# Patient Record
Sex: Female | Born: 1985 | Race: White | Hispanic: No | Marital: Single | Smoking: Current some day smoker
Health system: Southern US, Community
[De-identification: ages and names within clinical notes are randomized; demographics above are authoritative.]

## PROBLEM LIST (undated history)

## (undated) DIAGNOSIS — G629 Polyneuropathy, unspecified: Secondary | ICD-10-CM

## (undated) DIAGNOSIS — F1111 Opioid abuse, in remission: Secondary | ICD-10-CM

## (undated) DIAGNOSIS — I38 Endocarditis, valve unspecified: Secondary | ICD-10-CM

## (undated) DIAGNOSIS — F319 Bipolar disorder, unspecified: Secondary | ICD-10-CM

## (undated) DIAGNOSIS — I10 Essential (primary) hypertension: Secondary | ICD-10-CM

## (undated) DIAGNOSIS — Z954 Presence of other heart-valve replacement: Secondary | ICD-10-CM

## (undated) HISTORY — PX: CARDIAC SURGERY: SHX584

## (undated) HISTORY — DX: Essential (primary) hypertension: I10

## (undated) HISTORY — PX: TRICUSPID VALVE REPLACEMENT: SHX816

## (undated) HISTORY — DX: Presence of other heart-valve replacement: Z95.4

## (undated) HISTORY — DX: Opioid abuse, in remission: F11.11

## (undated) HISTORY — DX: Bipolar disorder, unspecified: F31.9

---

## 2015-04-07 ENCOUNTER — Other Ambulatory Visit: Payer: Self-pay | Admitting: Specialist

## 2015-04-07 DIAGNOSIS — R0602 Shortness of breath: Secondary | ICD-10-CM

## 2015-04-12 ENCOUNTER — Other Ambulatory Visit: Payer: Self-pay | Admitting: Specialist

## 2015-04-13 ENCOUNTER — Ambulatory Visit
Admission: RE | Admit: 2015-04-13 | Discharge: 2015-04-13 | Disposition: A | Discharge status: Home / Self Care | Payer: BLUE CROSS/BLUE SHIELD | Source: Ambulatory Visit | Attending: Specialist | Admitting: Specialist

## 2015-04-13 DIAGNOSIS — R0602 Shortness of breath: Secondary | ICD-10-CM | POA: Insufficient documentation

## 2015-04-21 ENCOUNTER — Encounter (HOSPITAL_COMMUNITY): Payer: Self-pay | Admitting: Emergency Medicine

## 2015-04-21 ENCOUNTER — Emergency Department (HOSPITAL_COMMUNITY)
Admission: EM | Admit: 2015-04-21 | Discharge: 2015-04-21 | Disposition: A | Discharge status: Home / Self Care | Payer: BLUE CROSS/BLUE SHIELD | Attending: Emergency Medicine | Admitting: Emergency Medicine

## 2015-04-21 DIAGNOSIS — Y998 Other external cause status: Secondary | ICD-10-CM | POA: Insufficient documentation

## 2015-04-21 DIAGNOSIS — R21 Rash and other nonspecific skin eruption: Secondary | ICD-10-CM | POA: Diagnosis not present

## 2015-04-21 DIAGNOSIS — Y9389 Activity, other specified: Secondary | ICD-10-CM | POA: Diagnosis not present

## 2015-04-21 DIAGNOSIS — M791 Myalgia: Secondary | ICD-10-CM | POA: Insufficient documentation

## 2015-04-21 DIAGNOSIS — J069 Acute upper respiratory infection, unspecified: Secondary | ICD-10-CM

## 2015-04-21 DIAGNOSIS — X58XXXA Exposure to other specified factors, initial encounter: Secondary | ICD-10-CM | POA: Diagnosis not present

## 2015-04-21 DIAGNOSIS — R05 Cough: Secondary | ICD-10-CM | POA: Diagnosis present

## 2015-04-21 DIAGNOSIS — Y9289 Other specified places as the place of occurrence of the external cause: Secondary | ICD-10-CM | POA: Diagnosis not present

## 2015-04-21 DIAGNOSIS — S50852A Superficial foreign body of left forearm, initial encounter: Secondary | ICD-10-CM | POA: Diagnosis not present

## 2015-04-21 DIAGNOSIS — R252 Cramp and spasm: Secondary | ICD-10-CM

## 2015-04-21 DIAGNOSIS — I38 Endocarditis, valve unspecified: Secondary | ICD-10-CM | POA: Diagnosis not present

## 2015-04-21 DIAGNOSIS — Z720 Tobacco use: Secondary | ICD-10-CM | POA: Insufficient documentation

## 2015-04-21 HISTORY — DX: Endocarditis, valve unspecified: I38

## 2015-04-21 LAB — I-STAT CHEM 8, ED
BUN: 14 mg/dL (ref 6–20)
CALCIUM ION: 1.21 mmol/L (ref 1.12–1.23)
CHLORIDE: 105 mmol/L (ref 101–111)
CREATININE: 0.9 mg/dL (ref 0.44–1.00)
Glucose, Bld: 109 mg/dL — ABNORMAL HIGH (ref 65–99)
HEMATOCRIT: 41 % (ref 36.0–46.0)
Hemoglobin: 13.9 g/dL (ref 12.0–15.0)
Potassium: 3.5 mmol/L (ref 3.5–5.1)
Sodium: 142 mmol/L (ref 135–145)
TCO2: 21 mmol/L (ref 0–100)

## 2015-04-21 NOTE — Discharge Instructions (Signed)
RECOMMEND SUPPORTIVE CARE OF SYMPTOMS OF RESPIRATORY VIRUS - ANTIHISTAMINES, TYLENOL/IBUPROFEN AS NEEDED, PUSH FLUIDS. YOU CAN ALLOW THE REMAINING SUTURE TO WORK ITSELF OUT OR FOLLOW UP WITH SURGERY FOR REMOVAL. MUSCLE CRAMPING CAN BE TREATED WITH LOTS OF FLUIDS (WATER, JUICES, NON-CAFFEINATED BEVERAGES).  FOLLOW UP WITH YOUR DOCTOR FOR ROUTINE MEDICAL CONCERNS.   Leg Cramps Leg cramps that occur during exercise can be caused by poor circulation or dehydration. However, muscle cramps that occur at rest or during the night are usually not due to any serious medical problem. Heat cramps may cause muscle spasms during hot weather.  CAUSES There is no clear cause for muscle cramps. However, dehydration may be a factor for those who do not drink enough fluids and those who exercise in the heat. Imbalances in the level of sodium, potassium, calcium or magnesium in the muscle tissue may also be a factor. Some medications, such as water pills (diuretics), may cause loss of chemicals that the body needs (like sodium and potassium) and cause muscle cramps. TREATMENT   Make sure your diet has enough fluids and essential minerals for the muscle to work normally.  Avoid strenuous exercise for several days if you have been having frequent leg cramps.  Stretch and massage the cramped muscle for several minutes.  Some medicines may be helpful in some patients with night cramps. Only take over-the-counter or prescription medicines as directed by your caregiver. SEEK IMMEDIATE MEDICAL CARE IF:   Your leg cramps become worse.  Your foot becomes cold, numb, or blue. Document Released: 11/30/2004 Document Revised: 01/15/2012 Document Reviewed: 11/17/2008 [facility] Patient Information 2015 [place], [place]. This information is not intended to replace advice given to you by your health care provider. Make sure you discuss any questions you have with your health care provider. Upper Respiratory Infection, Adult An  upper respiratory infection (URI) is also sometimes known as the common cold. The upper respiratory tract includes the nose, sinuses, throat, trachea, and bronchi. Bronchi are the airways leading to the lungs. Most people improve within 1 week, but symptoms can last up to 2 weeks. A residual cough may last even longer.  CAUSES Many different viruses can infect the tissues lining the upper respiratory tract. The tissues become irritated and inflamed and often become very moist. Mucus production is also common. A cold is contagious. You can easily spread the virus to others by oral contact. This includes kissing, sharing a glass, coughing, or sneezing. Touching your mouth or nose and then touching a surface, which is then touched by another person, can also spread the virus. SYMPTOMS  Symptoms typically develop 1 to 3 days after you come in contact with a cold virus. Symptoms vary from person to person. They may include:  Runny nose.  Sneezing.  Nasal congestion.  Sinus irritation.  Sore throat.  Loss of voice (laryngitis).  Cough.  Fatigue.  Muscle aches.  Loss of appetite.  Headache.  Low-grade fever. DIAGNOSIS  You might diagnose your own cold based on familiar symptoms, since most people get a cold 2 to 3 times a year. Your caregiver can confirm this based on your exam. Most importantly, your caregiver can check that your symptoms are not due to another disease such as strep throat, sinusitis, pneumonia, asthma, or epiglottitis. Blood tests, throat tests, and X-rays are not necessary to diagnose a common cold, but they may sometimes be helpful in excluding other more serious diseases. Your caregiver will decide if any further tests are required. RISKS AND COMPLICATIONS  You may be at risk for a more severe case of the common cold if you smoke cigarettes, have chronic heart disease (such as heart failure) or lung disease (such as asthma), or if you have a weakened immune system. The  very young and very old are also at risk for more serious infections. Bacterial sinusitis, middle ear infections, and bacterial pneumonia can complicate the common cold. The common cold can worsen asthma and chronic obstructive pulmonary disease (COPD). Sometimes, these complications can require emergency medical care and may be life-threatening. PREVENTION  The best way to protect against getting a cold is to practice good hygiene. Avoid oral or hand contact with people with cold symptoms. Wash your hands often if contact occurs. There is no clear evidence that vitamin C, vitamin E, echinacea, or exercise reduces the chance of developing a cold. However, it is always recommended to get plenty of rest and practice good nutrition. TREATMENT  Treatment is directed at relieving symptoms. There is no cure. Antibiotics are not effective, because the infection is caused by a virus, not by bacteria. Treatment may include:  Increased fluid intake. Sports drinks offer valuable electrolytes, sugars, and fluids.  Breathing heated mist or steam (vaporizer or shower).  Eating chicken soup or other clear broths, and maintaining good nutrition.  Getting plenty of rest.  Using gargles or lozenges for comfort.  Controlling fevers with ibuprofen or acetaminophen as directed by your caregiver.  Increasing usage of your inhaler if you have asthma. Zinc gel and zinc lozenges, taken in the first 24 hours of the common cold, can shorten the duration and lessen the severity of symptoms. Pain medicines may help with fever, muscle aches, and throat pain. A variety of non-prescription medicines are available to treat congestion and runny nose. Your caregiver can make recommendations and may suggest nasal or lung inhalers for other symptoms.  HOME CARE INSTRUCTIONS   Only take over-the-counter or prescription medicines for pain, discomfort, or fever as directed by your caregiver.  Use a warm mist humidifier or inhale  steam from a shower to increase air moisture. This may keep secretions moist and make it easier to breathe.  Drink enough water and fluids to keep your urine clear or pale yellow.  Rest as needed.  Return to work when your temperature has returned to normal or as your caregiver advises. You may need to stay home longer to avoid infecting others. You can also use a face mask and careful hand washing to prevent spread of the virus. SEEK MEDICAL CARE IF:   After the first few days, you feel you are getting worse rather than better.  You need your caregiver's advice about medicines to control symptoms.  You develop chills, worsening shortness of breath, or brown or red sputum. These may be signs of pneumonia.  You develop yellow or brown nasal discharge or pain in the face, especially when you bend forward. These may be signs of sinusitis.  You develop a fever, swollen neck glands, pain with swallowing, or white areas in the back of your throat. These may be signs of strep throat. SEEK IMMEDIATE MEDICAL CARE IF:   You have a fever.  You develop severe or persistent headache, ear pain, sinus pain, or chest pain.  You develop wheezing, a prolonged cough, cough up blood, or have a change in your usual mucus (if you have chronic lung disease).  You develop sore muscles or a stiff neck. Document Released: 04/18/2001 Document Revised: 01/15/2012 Document Reviewed: 01/28/2014  [employer]® Patient Information ©2015 [employer]. This information is not intended to replace advice given to you by your health care provider. Make sure you discuss any questions you have with your health care provider. ° °

## 2015-04-21 NOTE — ED Provider Notes (Signed)
CSN: [id number]     Arrival date & time 04/21/15  0010 History   First MD Initiated Contact with Patient 04/21/15 0048     Chief Complaint  Patient presents with  . URI     (Consider location/radiation/quality/duration/timing/severity/associated sxs/prior Treatment) Patient is a 29 y.o. female presenting with URI. The history is provided by the patient. No language interpreter was used.  URI Presenting symptoms: congestion, cough, rhinorrhea and sore throat   Presenting symptoms: no fever   Severity:  Mild Associated symptoms comment:  Congestion, cough, sore throat for one month. No known fever. She also complains of foreign body in the left arm that she feels is a stitch left in from treatment of an abscess. She also complains of severe cramping in her calves bilaterally, one side or the other, and a rash to lower extremities. Cramping and rah have been worsening over time. 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    Past Medical History  Diagnosis Date  . Endocarditis     drug use   Past Surgical History  Procedure Laterality Date  . Tricuspid valve replacement      x 3; open heart surgery   History reviewed. No pertinent family history. History  Substance Use Topics  . Smoking status: Current Every Day Smoker -- 0.50 packs/day  . Smokeless tobacco: Not on file  . Alcohol Use: No   OB History    No data available     Review of Systems  Constitutional: Negative for fever and chills.  HENT: Positive for congestion, rhinorrhea and sore throat.   Respiratory: Positive for cough.   Cardiovascular: Negative.   Gastrointestinal: Negative.  Negative for nausea and vomiting.  Musculoskeletal:       See HPI.  Skin: Positive for rash.  Neurological: Negative.       Allergies  Kiwi extract; Pineapple; Sulfa antibiotics; and Tramadol  Home Medications   Prior to Admission medications   Not on File   BP 143/99 mmHg  Pulse 98  Temp(Src)  98.8 F (37.1 C) (Oral)  Resp 18  SpO2 100%  LMP 03/31/2015 Physical Exam  Constitutional: She is oriented to person, place, and time. She appears well-developed and well-nourished.  HENT:  Head: Normocephalic.  Neck: Normal range of motion. Neck supple.  Cardiovascular: Normal rate and regular rhythm.   Pulmonary/Chest: Effort normal and breath sounds normal.  Abdominal: Soft. Bowel sounds are normal. There is no tenderness. There is no rebound and no guarding.  Musculoskeletal: Normal range of motion.  Calf tenderness bilaterally. Soft muscle body.   Neurological: She is alert and oriented to person, place, and time.  Skin: Skin is warm and dry. No rash noted.  Non-blanching petechial rash bilateral LE's anteriorly.   Psychiatric: She has a normal mood and affect.    ED Course  Procedures (including critical care time) Labs Review Labs Reviewed  I-STAT CHEM 8, ED   Results for orders placed or performed during the hospital encounter of 04/21/15  I-stat Chem 8, ED  Result Value Ref Range   Sodium 142 135 - 145 mmol/L   Potassium 3.5 3.5 - 5.1 mmol/L   Chloride 105 101 - 111 mmol/L   BUN 14 6 - 20 mg/dL   Creatinine, Ser 2.80 0.44 - 1.00 mg/dL   Glucose, Bld 034 (H) 65 - 99 mg/dL   Calcium, Ion 9.17 9.15 - 1.23 mmol/L   TCO2 21 0 - 100 mmol/L   Hemoglobin 13.9 12.0 -  15.0 g/dL   HCT 40.9 81.1 - 91.4 %    Imaging Review No results found.   EKG Interpretation None      MDM   Final diagnoses:  None    1. URI 2. Foreign body, left FA 3. Muscle cramps  URI - lungs clear, afebrile, normal O2 sat/VS - suspect viral URI. FB left FA - appears to be suture material embedded in arm at scarred site. Discussed with Dr. Nicanor Alcon. Will not attempt removal. Recommended natural expulsion or, if patient prefers, follow up with surgery. Muscle cramping: potassium normal. Will provide Flexeril for symptomatic relief.   Strongly encouraged follow up with PCP.    Elpidio Anis, PA-C 04/21/15 0211  April Palumbo, MD 04/21/15 0225

## 2015-04-21 NOTE — ED Notes (Signed)
Pt states that she has had a cough and nasal drainage, she has an abcess with a stitch stuck in her arm and she has had cramping in the back of her legs. Alert and oriented.

## 2015-04-22 ENCOUNTER — Other Ambulatory Visit: Payer: Self-pay | Admitting: Internal Medicine

## 2015-04-22 ENCOUNTER — Ambulatory Visit
Admission: RE | Admit: 2015-04-22 | Discharge: 2015-04-22 | Disposition: A | Discharge status: Home / Self Care | Payer: BLUE CROSS/BLUE SHIELD | Source: Ambulatory Visit | Attending: Internal Medicine | Admitting: Internal Medicine

## 2015-04-22 DIAGNOSIS — M79661 Pain in right lower leg: Secondary | ICD-10-CM | POA: Insufficient documentation

## 2015-04-22 DIAGNOSIS — M7989 Other specified soft tissue disorders: Secondary | ICD-10-CM | POA: Diagnosis not present

## 2015-04-22 DIAGNOSIS — M79662 Pain in left lower leg: Secondary | ICD-10-CM | POA: Insufficient documentation

## 2015-09-30 ENCOUNTER — Encounter (HOSPITAL_COMMUNITY): Payer: Self-pay | Admitting: *Deleted

## 2015-09-30 DIAGNOSIS — Z791 Long term (current) use of non-steroidal anti-inflammatories (NSAID): Secondary | ICD-10-CM | POA: Diagnosis not present

## 2015-09-30 DIAGNOSIS — Z7982 Long term (current) use of aspirin: Secondary | ICD-10-CM | POA: Insufficient documentation

## 2015-09-30 DIAGNOSIS — Z79818 Long term (current) use of other agents affecting estrogen receptors and estrogen levels: Secondary | ICD-10-CM | POA: Diagnosis not present

## 2015-09-30 DIAGNOSIS — M549 Dorsalgia, unspecified: Secondary | ICD-10-CM | POA: Diagnosis not present

## 2015-09-30 DIAGNOSIS — R61 Generalized hyperhidrosis: Secondary | ICD-10-CM | POA: Diagnosis not present

## 2015-09-30 DIAGNOSIS — I2699 Other pulmonary embolism without acute cor pulmonale: Secondary | ICD-10-CM | POA: Insufficient documentation

## 2015-09-30 DIAGNOSIS — Z7951 Long term (current) use of inhaled steroids: Secondary | ICD-10-CM | POA: Insufficient documentation

## 2015-09-30 DIAGNOSIS — F17210 Nicotine dependence, cigarettes, uncomplicated: Secondary | ICD-10-CM | POA: Diagnosis not present

## 2015-09-30 DIAGNOSIS — Z79899 Other long term (current) drug therapy: Secondary | ICD-10-CM | POA: Insufficient documentation

## 2015-09-30 DIAGNOSIS — R079 Chest pain, unspecified: Secondary | ICD-10-CM | POA: Diagnosis present

## 2015-09-30 DIAGNOSIS — R011 Cardiac murmur, unspecified: Secondary | ICD-10-CM | POA: Insufficient documentation

## 2015-09-30 DIAGNOSIS — R110 Nausea: Secondary | ICD-10-CM | POA: Diagnosis not present

## 2015-09-30 DIAGNOSIS — R0789 Other chest pain: Secondary | ICD-10-CM | POA: Diagnosis not present

## 2015-09-30 NOTE — ED Notes (Signed)
Pt c/o chest pain, seeing black dots, dry mouth and dry nose.

## 2015-10-01 ENCOUNTER — Emergency Department (HOSPITAL_COMMUNITY): Payer: Medicare Other

## 2015-10-01 ENCOUNTER — Emergency Department (HOSPITAL_COMMUNITY)
Admission: EM | Admit: 2015-10-01 | Discharge: 2015-10-01 | Disposition: A | Discharge status: Home / Self Care | Payer: Medicare Other | Attending: Emergency Medicine | Admitting: Emergency Medicine

## 2015-10-01 DIAGNOSIS — I2699 Other pulmonary embolism without acute cor pulmonale: Secondary | ICD-10-CM

## 2015-10-01 DIAGNOSIS — R079 Chest pain, unspecified: Secondary | ICD-10-CM

## 2015-10-01 LAB — BASIC METABOLIC PANEL
Anion gap: 11 (ref 5–15)
BUN: 15 mg/dL (ref 6–20)
CHLORIDE: 105 mmol/L (ref 101–111)
CO2: 24 mmol/L (ref 22–32)
Calcium: 9.9 mg/dL (ref 8.9–10.3)
Creatinine, Ser: 0.97 mg/dL (ref 0.44–1.00)
GFR calc Af Amer: >60 mL/min (ref >60)
GFR calc non Af Amer: >60 mL/min (ref >60)
GLUCOSE: 145 mg/dL — AB (ref 65–99)
POTASSIUM: 4 mmol/L (ref 3.5–5.1)
Sodium: 140 mmol/L (ref 135–145)

## 2015-10-01 LAB — CBC
HEMATOCRIT: 42.9 % (ref 36.0–46.0)
Hemoglobin: 14.5 g/dL (ref 12.0–15.0)
MCH: 30.3 pg (ref 26.0–34.0)
MCHC: 33.8 g/dL (ref 30.0–36.0)
MCV: 89.7 fL (ref 78.0–100.0)
Platelets: 232 10*3/uL (ref 150–400)
RBC: 4.78 MIL/uL (ref 3.87–5.11)
RDW: 14.6 % (ref 11.5–15.5)
WBC: 11.7 10*3/uL — ABNORMAL HIGH (ref 4.0–10.5)

## 2015-10-01 LAB — I-STAT TROPONIN, ED: Troponin i, poc: 0.02 ng/mL (ref 0.00–0.08)

## 2015-10-01 MED ORDER — XARELTO VTE STARTER PACK 15 & 20 MG PO TBPK: 15.0000 mg | ORAL_TABLET | ORAL | Status: DC

## 2015-10-01 MED ORDER — HYDROCODONE-ACETAMINOPHEN 5-325 MG PO TABS
1.0000 | ORAL_TABLET | Freq: Once | ORAL | Status: AC
  Administered 2015-10-01: 1 via ORAL
  Filled 2015-10-01: qty 1

## 2015-10-01 MED ORDER — SODIUM CHLORIDE 0.9 % IV BOLUS (SEPSIS)
1000.0000 mL | Freq: Once | INTRAVENOUS | Status: AC
  Administered 2015-10-01: 1000 mL via INTRAVENOUS

## 2015-10-01 MED ORDER — IOHEXOL 350 MG/ML SOLN
100.0000 mL | Freq: Once | INTRAVENOUS | Status: AC | PRN
  Administered 2015-10-01: 100 mL via INTRAVENOUS

## 2015-10-01 MED ORDER — IBUPROFEN 400 MG PO TABS
600.0000 mg | ORAL_TABLET | Freq: Once | ORAL | Status: AC
  Administered 2015-10-01: 600 mg via ORAL
  Filled 2015-10-01: qty 1

## 2015-10-01 MED ORDER — RIVAROXABAN 15 MG PO TABS
15.0000 mg | ORAL_TABLET | Freq: Once | ORAL | Status: AC
  Administered 2015-10-01: 15 mg via ORAL
  Filled 2015-10-01: qty 1

## 2015-10-01 NOTE — ED Notes (Signed)
Dr. Nanavanti at the bedside.  

## 2015-10-01 NOTE — ED Provider Notes (Signed)
CSN: [id number]     Arrival date & time 09/30/15  2333 History  By signing my name below, I, Kelly Mora, attest that this documentation has been prepared under the direction and in the presence of Kelly Kaplan, MD. Electronically Signed: Doreatha Mora, ED Scribe. 10/01/2015. 1:22 AM.    Chief Complaint  Patient presents with  . Chest Pain   The history is provided by the patient. No language interpreter was used.    HPI Comments: Kelly Mora is a 29 y.o. female with h/o endocarditis, PE who presents to the Emergency Department complaining of moderate, constant, sharp, central CP with radiation to her torso onset yesterday with associated SOB, lower back pain, nausea, diaphoresis for a week, increased areas of redness on her skin. Pt states that her CP is worsened with deep breathing and lying flat. 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 Pt [place] [facility] [place] denies current anticoagulant use. 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 She states she is not sure how her previous episoes of endocarditis presented because she was under the influence of drugs. PCP is in [place]. She is followed by pulmonology, cardiology at [facility]. No recent antibiotic use. Recent dental extraction 2 months ago. She denies emesis, fever, night sweats, cough.   Past Medical History  Diagnosis Date  . Endocarditis     drug use   Past Surgical History  Procedure Laterality Date  . Tricuspid valve replacement      x 3; open heart surgery   No family history on file. Social History  Substance Use Topics  . Smoking status: Current Every Day Smoker -- 1.00 packs/day    Types: Cigarettes  . Smokeless tobacco: None  . Alcohol Use: No   OB History    No data available      Review of Systems  Constitutional: Positive for diaphoresis. Negative for fever.  Respiratory: Positive for shortness of breath. Negative for cough.   Cardiovascular: Positive for chest pain.  Gastrointestinal: Positive for nausea. Negative for vomiting.  Musculoskeletal: Positive for back pain.  A complete 10 system review of systems was obtained and all systems are negative except as noted in the HPI and PMH.    Allergies  Kiwi extract; Pineapple; Sulfa antibiotics; and Tramadol  Home Medications   Prior to Admission medications   Medication Sig Start Date End Date Taking? Authorizing Provider  albuterol (PROVENTIL HFA;VENTOLIN HFA) 108 (90 BASE) MCG/ACT inhaler Inhale 2 puffs into the lungs every 6 (six) hours as needed for wheezing or shortness of breath.   Yes Historical Provider, MD  aspirin EC 81 MG tablet Take 81 mg by mouth daily.   Yes Historical Provider, MD  benztropine (COGENTIN) 0.5 MG tablet Take 0.5 mg by mouth at bedtime.   Yes Historical Provider, MD  cloNIDine (CATAPRES) 0.1 MG tablet Take 0.1 mg by mouth 2 (two) times daily.   Yes Historical Provider, MD  diltiazem (CARDIZEM) 30 MG tablet Take 30 mg by mouth 2 (two) times daily.   Yes Historical Provider, MD  Fluticasone Furoate-Vilanterol 100-25 MCG/INH AEPB Inhale 1 puff into the lungs daily.   Yes Historical Provider, MD  gabapentin (NEURONTIN) 300 MG capsule Take 800 mg by mouth 4 (four) times daily.   Yes Historical Provider,  MD  hydrOXYzine (VISTARIL) 50 MG capsule Take 50 mg by mouth 3 (three) times daily as needed for itching.   Yes Historical Provider, MD  meloxicam (MOBIC) 15 MG tablet Take 15 mg by mouth daily.   Yes Historical Provider, MD  metoprolol tartrate (LOPRESSOR) 25 MG tablet Take 25 mg by mouth 2 (two) times daily.   Yes Historical Provider, MD  Multiple Vitamin (MULTIVITAMIN WITH MINERALS) TABS tablet Take 1 tablet by mouth daily.   Yes Historical Provider, MD  norgestimate-ethinyl estradiol  (ORTHO-CYCLEN,SPRINTEC,PREVIFEM) 0.25-35 MG-MCG tablet Take 1 tablet by mouth daily.   Yes Historical Provider, MD  nortriptyline (PAMELOR) 50 MG capsule Take 50 mg by mouth at bedtime.   Yes Historical Provider, MD  QUEtiapine (SEROQUEL) 100 MG tablet Take 150 mg by mouth at bedtime.   Yes Historical Provider, MD  sertraline (ZOLOFT) 50 MG tablet Take 50 mg by mouth daily.   Yes Historical Provider, MD  XARELTO STARTER PACK 15 & 20 MG TBPK Take 15-20 mg by mouth as directed. Take as directed on package: Start with one 15mg  tablet by mouth twice a day with food. On Day 22, switch to one 20mg  tablet once a day with food. 10/01/15   Kelly Fullam Rhunette CroftNanavati, MD   BP 114/70 mmHg  Pulse 66  Temp(Src) 97.7 F (36.5 C) (Oral)  Resp 18  Ht 5\' 4"  (1.626 m)  Wt 160 lb (72.576 kg)  BMI 27.45 kg/m2  SpO2 97%  LMP 08/23/2015 Physical Exam  Constitutional: She is oriented to person, place, and time. She appears well-developed and well-nourished.  HENT:  Head: Normocephalic and atraumatic.  Eyes: Conjunctivae and EOM are normal. Pupils are equal, round, and reactive to light.  Neck: Normal range of motion. Neck supple.  Cardiovascular: Normal rate and regular rhythm.   Murmur ( pansystolic murmur ) heard. Pulmonary/Chest: Effort normal and breath sounds normal. No respiratory distress.  Lungs CTA bilaterally.   Abdominal: Soft. She exhibits no distension.  Musculoskeletal: Normal range of motion.  Neurological: She is alert and oriented to person, place, and time.  Skin: Skin is warm and dry.  Psychiatric: She has a normal mood and affect. Her behavior is normal.  Nursing note and vitals reviewed.  ED Course  Procedures (including critical care time) DIAGNOSTIC STUDIES: Oxygen Saturation is 96% on RA, adequate by my interpretation.    COORDINATION OF CARE: 1:21 AM Discussed treatment plan with pt at bedside and pt agreed to plan.   Labs Review Labs Reviewed  BASIC METABOLIC PANEL - Abnormal;  Notable for the following:    Glucose, Bld 145 (*)    All other components within normal limits  CBC - Abnormal; Notable for the following:    WBC 11.7 (*)    All other components within normal limits  CULTURE, BLOOD (ROUTINE X 2)  CULTURE, BLOOD (ROUTINE X 2)  I-STAT TROPOININ, ED    Imaging Review No results found. I have personally reviewed and evaluated these images and lab results as part of my medical decision-making.   EKG Interpretation   Date/Time:  Thursday September 30 2015 23:47:09 EST Ventricular Rate:  99 PR Interval:  146 QRS Duration: 102 QT Interval:  354 QTC Calculation: 454 R Axis:   105 Text Interpretation:  Normal sinus rhythm Incomplete right bundle branch  block Possible Right ventricular hypertrophy Nonspecific T wave  abnormality - v3, v4 [id number] pattern appreciated Abnormal ECG No old  tracing to compare Confirmed by Rhunette CroftNANAVATI, MD, Janey GentaANKIT [phone] on 10/01/2015  1:07:39 AM      MDM   Final diagnoses:  Chest pain  Other acute pulmonary embolism without acute cor pulmonale (HCC)    I personally performed the services described in this documentation, which was scribed in my presence. The recorded information has been reviewed and is accurate.  Pt comes in with cc of chest pain. She has pleuritic type chest pain. Pt has hx of ivda and recurrent endocarditis, and she claims to not have used any drugs since Jan (last endocarditis surgery). She has atypical pain (for cardiac cause), but she has a murmur and she has pleuritic component to her pain. Pain could be due to endocarditis - she reports some mottling of her skin intermittently and intermittent night sweats. She also had dental workup about a month ago, though she did take prophylactic antibiotics. Blood cultures ordered, we have pansystolic murmur.  Additionally - PE is in the ddx as well. Pt has hx of PE, her WELLS score is high based on HR > 100, hx of PE and PE high on the ddx for the current  pleuritic chest pain. CT PE ordered.   9:13 AM CT PE is + for PE. We have started pt on xarelto. She has a small PE, significance not known compared to prior CT since we have no image to compare the current CT with. She is stable overall. Xarelto started, diagnosis discussed, and CD of her CT provided - advised to see Pulmonologist and his PCP.  Kelly Kaplan, MD 10/03/15 [phone]

## 2015-10-01 NOTE — Discharge Instructions (Signed)
The CT scan here shows small PE. Result is as following:  IMPRESSION: Filling defects demonstrated in subsegmental branches to both lower lobes likely represent peripheral emboli. No large central emboli. Probable atelectasis in the lung bases.  All the other results are normal. We have started you on Xarelto for the PE. Please see your pulmonologist with the CT scan of the lungs to see if the blood thinner needs to be continued.   Please return to the ER if you have worsening chest pain, shortness of breath, pain radiating to your jaw, shoulder, or back, sweats or fainting. Otherwise see the Cardiologist or your primary care doctor as requested.    Nonspecific Chest Pain  Chest pain can be caused by many different conditions. There is always a chance that your pain could be related to something serious, such as a heart attack or a blood clot in your lungs. Chest pain can also be caused by conditions that are not life-threatening. If you have chest pain, it is very important to follow up with your health care provider. CAUSES  Chest pain can be caused by:  Heartburn.  Pneumonia or bronchitis.  Anxiety or stress.  Inflammation around your heart (pericarditis) or lung (pleuritis or pleurisy).  A blood clot in your lung.  A collapsed lung (pneumothorax). It can develop suddenly on its own (spontaneous pneumothorax) or from trauma to the chest.  Shingles infection (varicella-zoster virus).  Heart attack.  Damage to the bones, muscles, and cartilage that make up your chest wall. This can include:  Bruised bones due to injury.  Strained muscles or cartilage due to frequent or repeated coughing or overwork.  Fracture to one or more ribs.  Sore cartilage due to inflammation (costochondritis). RISK FACTORS  Risk factors for chest pain may include:  Activities that increase your risk for trauma or injury to your chest.  Respiratory infections or conditions that cause frequent  coughing.  Medical conditions or overeating that can cause heartburn.  Heart disease or family history of heart disease.  Conditions or health behaviors that increase your risk of developing a blood clot.  Having had chicken pox (varicella zoster). SIGNS AND SYMPTOMS Chest pain can feel like:  Burning or tingling on the surface of your chest or deep in your chest.  Crushing, pressure, aching, or squeezing pain.  Dull or sharp pain that is worse when you move, cough, or take a deep breath.  Pain that is also felt in your back, neck, shoulder, or arm, or pain that spreads to any of these areas. Your chest pain may come and go, or it may stay constant. DIAGNOSIS Lab tests or other studies may be needed to find the cause of your pain. Your health care provider may have you take a test called an ambulatory ECG (electrocardiogram). An ECG records your heartbeat patterns at the time the test is performed. You may also have other tests, such as:  Transthoracic echocardiogram (TTE). During echocardiography, sound waves are used to create a picture of all of the heart structures and to look at how blood flows through your heart.  Transesophageal echocardiogram (TEE).This is a more advanced imaging test that obtains images from inside your body. It allows your health care provider to see your heart in finer detail.  Cardiac monitoring. This allows your health care provider to monitor your heart rate and rhythm in real time.  Holter monitor. This is a portable device that records your heartbeat and can help to diagnose abnormal heartbeats.  It allows your health care provider to track your heart activity for several days, if needed.  Stress tests. These can be done through exercise or by taking medicine that makes your heart beat more quickly.  Blood tests.  Imaging tests. TREATMENT  Your treatment depends on what is causing your chest pain. Treatment may include:  Medicines. These may  include:  Acid blockers for heartburn.  Anti-inflammatory medicine.  Pain medicine for inflammatory conditions.  Antibiotic medicine, if an infection is present.  Medicines to dissolve blood clots.  Medicines to treat coronary artery disease.  Supportive care for conditions that do not require medicines. This may include:  Resting.  Applying heat or cold packs to injured areas.  Limiting activities until pain decreases. HOME CARE INSTRUCTIONS  If you were prescribed an antibiotic medicine, finish it all even if you start to feel better.  Avoid any activities that bring on chest pain.  Do not use any tobacco products, including cigarettes, chewing tobacco, or electronic cigarettes. If you need help quitting, ask your health care provider.  Do not drink alcohol.  Take medicines only as directed by your health care provider.  Keep all follow-up visits as directed by your health care provider. This is important. This includes any further testing if your chest pain does not go away.  If heartburn is the cause for your chest pain, you may be told to keep your head raised (elevated) while sleeping. This reduces the chance that acid will go from your stomach into your esophagus.  Make lifestyle changes as directed by your health care provider. These may include:  Getting regular exercise. Ask your health care provider to suggest some activities that are safe for you.  Eating a heart-healthy diet. A registered dietitian can help you to learn healthy eating options.  Maintaining a healthy weight.  Managing diabetes, if necessary.  Reducing stress. SEEK MEDICAL CARE IF:  Your chest pain does not go away after treatment.  You have a rash with blisters on your chest.  You have a fever. SEEK IMMEDIATE MEDICAL CARE IF:   Your chest pain is worse.  You have an increasing cough, or you cough up blood.  You have severe abdominal pain.  You have severe weakness.  You  faint.  You have chills.  You have sudden, unexplained chest discomfort.  You have sudden, unexplained discomfort in your arms, back, neck, or jaw.  You have shortness of breath at any time.  You suddenly start to sweat, or your skin gets clammy.  You feel nauseous or you vomit.  You suddenly feel light-headed or dizzy.  Your heart begins to beat quickly, or it feels like it is skipping beats. These symptoms may represent a serious problem that is an emergency. Do not wait to see if the symptoms will go away. Get medical help right away. Call your local emergency services (911 in the [place] Do not drive yourself to the hospital.   This information is not intended to replace advice given to you by your health care provider. Make sure you discuss any questions you have with your health care provider.   Document Released: 08/02/2005 Document Revised: 11/13/2014 Document Reviewed: 05/29/2014 Elsevier Interactive Patient Education 2016 [employer]  Pulmonary Embolism A pulmonary embolism (PE) is a sudden blockage or decrease of blood flow in one lung or both lungs. Most blockages come from a blood clot that travels from the legs or the pelvis to the lungs. PE is a dangerous and  potentially life-threatening condition if it is not treated right away. CAUSES A pulmonary embolism occurs most commonly when a blood clot travels from one of your veins to your lungs. Rarely, PE is caused by air, fat, amniotic fluid, or part of a tumor traveling through your veins to your lungs. RISK FACTORS A PE is more likely to develop in:  People who smoke.  People who areolder, especially over 59 years of age.  People who are overweight (obese).  People who sit or lie still for a long time, such as during long-distance travel (over 4 hours), bed rest, hospitalization, or during recovery from certain medical conditions like a stroke.  People who do not engage in much physical activity (sedentary  lifestyle).  People who have chronic breathing disorders.  People whohave a personal or family history of blood clots or blood clotting disease.  People whohave peripheral vascular disease (PVD), diabetes, or some types of cancer.  People who haveheart disease, especially if the person had a recent heart attack or has congestive heart failure.  People who have neurological diseases that affect the legs (leg paresis).  People who have had a traumatic injury, such as breaking a hip or leg.  People whohave recently had major or lengthy surgery, especially on the hip, knee, or abdomen.  People who have hada central line placed inside a large vein.  People who takemedicines that contain the hormone estrogen. These include birth control pills and hormone replacement therapy.  Pregnancy or during childbirth or the postpartum period. SIGNS AND SYMPTOMS  The symptoms of a PE usually start suddenly and include:  Shortness of breath while active or at rest.  Coughing or coughing up blood or blood-tinged mucus.  Chest pain that is often worse with deep breaths.  Rapid or irregular heartbeat.  Feeling light-headed or dizzy.  Fainting.  Feelinganxious.  Sweating. There may also be pain and swelling in a leg if that is where the blood clot started. These symptoms may represent a serious problem that is an emergency. Do not wait to see if the symptoms will go away. Get medical help right away. Call your local emergency services (911 in the [place] Do not drive yourself to the hospital. DIAGNOSIS Your health care provider will take a medical history and perform a physical exam. You may also have other tests, including:  Blood tests to assess the clotting properties of your blood, assess oxygen levels in your blood, and find blood clots.  Imaging tests, such as CT, ultrasound, MRI, X-ray, and other tests to see if you have clots anywhere in your body.  An electrocardiogram (ECG)  to look for heart strain from blood clots in the lungs. TREATMENT The main goals of PE treatment are:  To stop a blood clot from growing larger.  To stop new blood clots from forming. The type of treatment that you receive depends on many factors, such as the cause of your PE, your risk for bleeding or developing more clots, and other medical conditions that you have. Sometimes, a combination of treatments is necessary. This condition may be treated with:  Medicines, including newer oral blood thinners (anticoagulants), warfarin, low molecular weight heparins, thrombolytics, or heparins.  Wearing compression stockings or using different types of devices.  Surgery (rare) to remove the blood clot or to place a filter in your abdomen to stop the blood clot from traveling to your lungs. Treatments for a PE are often divided into immediate treatment, long-term treatment (up to 3  months after PE), and extended treatment (more than 3 months after PE). Your treatment may continue for several months. This is called maintenance therapy, and it is used to prevent the forming of new blood clots. You can work with your health care provider to choose the treatment program that is best for you. What are anticoagulants? Anticoagulants are medicines that treat PEs. They can stop current blood clots from growing and stop new clots from forming. They cannot dissolve existing clots. Your body dissolves clots by itself over time. Anticoagulants are given by mouth, by injection, or through an IV tube. What are thrombolytics? Thrombolytics are clot-dissolving medicines that are used to dissolve a PE. They carry a high risk of bleeding, so they tend to be used only in severe cases or if you have very low blood pressure. HOME CARE INSTRUCTIONS If you are taking a newer oral anticoagulant:  Take the medicine every single day at the same time each day.  Understand what foods and drugs interact with this  medicine.  Understand that there are no regular blood tests required when using this medicine.  Understandthe side effects of this medicine, including excessive bruising or bleeding. Ask your health care provider or pharmacist about other possible side effects. If you are taking warfarin:  Understand how to take warfarin and know which foods can affect how warfarin works in [employer].  Understand that it is dangerous to taketoo much or too little warfarin. Too much warfarin increases the risk of bleeding. Too little warfarin continues to allow the risk for blood clots.  Follow your PT and INR blood testing schedule. The PT and INR results allow your health care provider to adjust your dose of warfarin. It is very important that you have your PT and INR tested as often as told by your health care provider.  Avoid major changes in your diet, or tell your health care provider before you change your diet. Arrange a visit with a registered dietitian to answer your questions. Many foods, especially foods that are high in vitamin K, can interfere with warfarin and affect the PT and INR results. Eat a consistent amount of foods that are high in vitamin K, such as:  Spinach, kale, broccoli, cabbage, collard greens, turnip greens, Brussels sprouts, peas, cauliflower, seaweed, and parsley.  Beef liver and pork liver.  Green tea.  Soybean oil.  Tell your health care provider about any and all medicines, vitamins, and supplements that you take, including aspirin and other over-the-counter anti-inflammatory medicines. Be especially cautious with aspirin and anti-inflammatory medicines. Do not take those before you ask your health care provider if it is safe to do so. This is important because many medicines can interfere with warfarin and affect the PT and INR results.  Do not start or stop taking any over-the-counter or prescription medicine unless your health care provider or pharmacist tells you to  do so. If you take warfarin, you will also need to do these things:  Hold pressure over cuts for longer than usual.  Tell your dentist and other health care providers that you are taking warfarin before you have any procedures in which bleeding may occur.  Avoid alcohol or drink very small amounts. Tell your health care provider if you change your alcohol intake.  Do not use tobacco products, including cigarettes, chewing tobacco, and e-cigarettes. If you need help quitting, ask your health care provider.  Avoid contact sports. General Instructions  Take over-the-counter and prescription medicines only as told  by your health care provider. Anticoagulant medicines can have side effects, including easy bruising and difficulty stopping bleeding. If you are prescribed an anticoagulant, you will also need to do these things:  Hold pressure over cuts for longer than usual.  Tell your dentist and other health care providers that you are taking anticoagulants before you have any procedures in which bleeding may occur.  Avoid contact sports.  Wear a medical alert bracelet or carry a medical alert card that says you have had a PE.  Ask your health care provider how soon you can go back to your normal activities. Stay active to prevent new blood clots from forming.  Make sure to exercise while traveling or when you have been sitting or standing for a long period of time. It is very important to exercise. Exercise your legs by walking or by tightening and relaxing your leg muscles often. Take frequent walks.  Wear compression stockings as told by your health care provider to help prevent more blood clots from forming.  Do not use tobacco products, including cigarettes, chewing tobacco, and e-cigarettes. If you need help quitting, ask your health care provider.  Keep all follow-up appointments with your health care provider. This is important. PREVENTION Take these actions to decrease your risk  of developing another PE:  Exercise regularly. For at least 30 minutes every day, engage in:  Activity that involves moving your arms and legs.  Activity that encourages good blood flow through your body by increasing your heart rate.  Exercise your arms and legs every hour during long-distance travel (over 4 hours). Drink plenty of water and avoid drinking alcohol while traveling.  Avoid sitting or lying in bed for long periods of time without moving your legs.  Maintain a weight that is appropriate for your height. Ask your health care provider what weight is healthy for you.  If you are a woman who is over 77 years of age, avoid unnecessary use of medicines that contain estrogen. These include birth control pills.  Do not smoke, especially if you take estrogen medicines. If you need help quitting, ask your health care provider.  If you are at very high risk for PE, wear compression stockings.  If you recently had a PE, have regularly scheduled ultrasound testing on your legs to check for new blood clots. If you are hospitalized, prevention measures may include:  Early walking after surgery, as soon as your health care provider says that it is safe.  Receiving anticoagulants to prevent blood clots. If you cannot take anticoagulants, other options may be available, such as wearing compression stockings or using different types of devices. SEEK IMMEDIATE MEDICAL CARE IF:  You have new or increased pain, swelling, or redness in an arm or leg.  You have numbness or tingling in an arm or leg.  You have shortness of breath while active or at rest.  You have chest pain.  You have a rapid or irregular heartbeat.  You feel light-headed or dizzy.  You cough up blood.  You notice blood in your vomit, bowel movement, or urine.  You have a fever. These symptoms may represent a serious problem that is an emergency. Do not wait to see if the symptoms will go away. Get medical help  right away. Call your local emergency services (911 in the [place] Do not drive yourself to the hospital.   This information is not intended to replace advice given to you by your health care provider. Make sure  you discuss any questions you have with your health care provider.   Document Released: 10/20/2000 Document Revised: 07/14/2015 Document Reviewed: 02/17/2015 Elsevier Interactive Patient Education [employer]

## 2015-10-01 NOTE — ED Notes (Signed)
Family at bedside. 

## 2015-10-06 LAB — CULTURE, BLOOD (ROUTINE X 2)
Culture: NO GROWTH
Culture: NO GROWTH

## 2015-10-19 ENCOUNTER — Emergency Department (HOSPITAL_COMMUNITY)
Admission: EM | Admit: 2015-10-19 | Discharge: 2015-10-20 | Disposition: A | Discharge status: Home / Self Care | Payer: Medicare Other | Attending: Physician Assistant | Admitting: Physician Assistant

## 2015-10-19 ENCOUNTER — Emergency Department (HOSPITAL_COMMUNITY): Payer: Medicare Other

## 2015-10-19 ENCOUNTER — Encounter (HOSPITAL_COMMUNITY): Payer: Self-pay | Admitting: Emergency Medicine

## 2015-10-19 DIAGNOSIS — Z791 Long term (current) use of non-steroidal anti-inflammatories (NSAID): Secondary | ICD-10-CM | POA: Diagnosis not present

## 2015-10-19 DIAGNOSIS — Z7901 Long term (current) use of anticoagulants: Secondary | ICD-10-CM | POA: Diagnosis not present

## 2015-10-19 DIAGNOSIS — G629 Polyneuropathy, unspecified: Secondary | ICD-10-CM | POA: Diagnosis not present

## 2015-10-19 DIAGNOSIS — Z79899 Other long term (current) drug therapy: Secondary | ICD-10-CM | POA: Insufficient documentation

## 2015-10-19 DIAGNOSIS — H538 Other visual disturbances: Secondary | ICD-10-CM | POA: Diagnosis not present

## 2015-10-19 DIAGNOSIS — Z7951 Long term (current) use of inhaled steroids: Secondary | ICD-10-CM | POA: Diagnosis not present

## 2015-10-19 DIAGNOSIS — Z3202 Encounter for pregnancy test, result negative: Secondary | ICD-10-CM | POA: Diagnosis not present

## 2015-10-19 DIAGNOSIS — Z8679 Personal history of other diseases of the circulatory system: Secondary | ICD-10-CM | POA: Insufficient documentation

## 2015-10-19 DIAGNOSIS — R079 Chest pain, unspecified: Secondary | ICD-10-CM | POA: Diagnosis present

## 2015-10-19 DIAGNOSIS — F17210 Nicotine dependence, cigarettes, uncomplicated: Secondary | ICD-10-CM | POA: Diagnosis not present

## 2015-10-19 DIAGNOSIS — R42 Dizziness and giddiness: Secondary | ICD-10-CM | POA: Diagnosis not present

## 2015-10-19 HISTORY — DX: Polyneuropathy, unspecified: G62.9

## 2015-10-19 LAB — CBC
HEMATOCRIT: 40.4 % (ref 36.0–46.0)
Hemoglobin: 13.6 g/dL (ref 12.0–15.0)
MCH: 30.6 pg (ref 26.0–34.0)
MCHC: 33.7 g/dL (ref 30.0–36.0)
MCV: 90.8 fL (ref 78.0–100.0)
PLATELETS: 237 10*3/uL (ref 150–400)
RBC: 4.45 MIL/uL (ref 3.87–5.11)
RDW: 14.6 % (ref 11.5–15.5)
WBC: 11.9 10*3/uL — AB (ref 4.0–10.5)

## 2015-10-19 LAB — I-STAT TROPONIN, ED: Troponin i, poc: 0.01 ng/mL (ref 0.00–0.08)

## 2015-10-19 NOTE — ED Notes (Signed)
Pt. reports central chest pain with SOB , dry cough and diaphoresis onset last night , denies nausea or fever .

## 2015-10-20 ENCOUNTER — Emergency Department (HOSPITAL_COMMUNITY): Payer: Medicare Other

## 2015-10-20 ENCOUNTER — Encounter (HOSPITAL_COMMUNITY): Payer: Self-pay

## 2015-10-20 DIAGNOSIS — R079 Chest pain, unspecified: Secondary | ICD-10-CM | POA: Diagnosis not present

## 2015-10-20 LAB — BASIC METABOLIC PANEL
Anion gap: 9 (ref 5–15)
BUN: 9 mg/dL (ref 6–20)
CHLORIDE: 106 mmol/L (ref 101–111)
CO2: 23 mmol/L (ref 22–32)
Calcium: 9.6 mg/dL (ref 8.9–10.3)
Creatinine, Ser: 0.92 mg/dL (ref 0.44–1.00)
Glucose, Bld: 129 mg/dL — ABNORMAL HIGH (ref 65–99)
POTASSIUM: 3.9 mmol/L (ref 3.5–5.1)
SODIUM: 138 mmol/L (ref 135–145)

## 2015-10-20 LAB — HCG, SERUM, QUALITATIVE: PREG SERUM: NEGATIVE

## 2015-10-20 MED ORDER — CYCLOBENZAPRINE HCL 10 MG PO TABS: 10.0000 mg | ORAL_TABLET | Freq: Two times a day (BID) | ORAL | Status: DC | PRN

## 2015-10-20 MED ORDER — OXYCODONE-ACETAMINOPHEN 5-325 MG PO TABS
1.0000 | ORAL_TABLET | Freq: Once | ORAL | Status: AC
  Administered 2015-10-20: 1 via ORAL
  Filled 2015-10-20: qty 1

## 2015-10-20 MED ORDER — OXYCODONE-ACETAMINOPHEN 5-325 MG PO TABS: 1.0000 | ORAL_TABLET | Freq: Four times a day (QID) | ORAL | Status: DC | PRN

## 2015-10-20 MED ORDER — IOHEXOL 350 MG/ML SOLN
100.0000 mL | Freq: Once | INTRAVENOUS | Status: AC | PRN
  Administered 2015-10-20: 100 mL via INTRAVENOUS

## 2015-10-20 NOTE — ED Provider Notes (Addendum)
CSN: [id number]     Arrival date & time 10/19/15  2306 History  By signing my name below, I, Kelly Mora, attest that this documentation has been prepared under the direction and in the presence of Jp Eastham Randall An, MD. Electronically Signed: Bethel Mora, ED Scribe. 10/20/2015. 2:31 AM    Chief Complaint  Patient presents with  . Chest Pain    The history is provided by the patient. No language interpreter was used.   Kelly Mora is a 29 y.o. female who presents to the Emergency Department complaining of worsened ,sharp, intermittent, and diffuse chest pain with onset last night. The pain started at the right arm and radiates to the back. Deep breathing at movement exacerbate the pain. Pt was diagnosed with a PE on 09/30/15 and is on Xarelto. Associated symptoms include lightheadedness, seeing black spots, and facial flushing. Pt denies fever and SOB. She is on hormonal birth control. Pt denies recent long travel and LE swelling. Her cardiologist is in [place] and she last saw them 2 months ago. 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   Past Medical History  Diagnosis Date  . Endocarditis     drug use  . Neuropathy [facility])    Past Surgical History  Procedure Laterality Date  . Tricuspid valve replacement      x 3; open heart surgery   No family history on file. Social History  Substance Use Topics  . Smoking status: Current Every Day Smoker -- 0.00 packs/day    Types: Cigarettes  . Smokeless tobacco: None  . Alcohol Use: No   OB History    No data available     Review of Systems  Eyes: Positive for visual disturbance (seeing black spots).  Respiratory: Negative for shortness of breath.   Cardiovascular: Positive for chest pain.  Skin: Positive for color change (facial flushing).  Neurological: Positive for light-headedness.  All other systems reviewed and  are negative.  Allergies  Kiwi extract; Pineapple; Sulfa antibiotics; and Tramadol  Home Medications   Prior to Admission medications   Medication Sig Start Date End Date Taking? Authorizing Provider  albuterol (PROVENTIL HFA;VENTOLIN HFA) 108 (90 BASE) MCG/ACT inhaler Inhale 2 puffs into the lungs every 6 (six) hours as needed for wheezing or shortness of breath.   Yes Historical Provider, MD  aspirin EC 81 MG tablet Take 81 mg by mouth daily.   Yes Historical Provider, MD  benztropine (COGENTIN) 0.5 MG tablet Take 0.5 mg by mouth at bedtime.   Yes Historical Provider, MD  cloNIDine (CATAPRES) 0.1 MG tablet Take 0.1 mg by mouth 2 (two) times daily.   Yes Historical Provider, MD  diltiazem (CARDIZEM) 30 MG tablet Take 30 mg by mouth 2 (two) times daily.   Yes Historical Provider, MD  Fluticasone Furoate-Vilanterol 100-25 MCG/INH AEPB Inhale 1 puff into the lungs daily.   Yes Historical Provider, MD  gabapentin (NEURONTIN) 300 MG capsule Take 800 mg by mouth 4 (four) times daily.   Yes Historical Provider, MD  hydrOXYzine (VISTARIL) 50 MG capsule Take 50 mg by mouth 3 (three) times daily as needed for itching.   Yes Historical Provider, MD  meloxicam (MOBIC) 15 MG tablet Take 15 mg by mouth daily.   Yes Historical Provider, MD  metoprolol tartrate (LOPRESSOR) 25 MG tablet Take 25 mg by mouth 2 (two) times daily.   Yes Historical Provider,  MD  Multiple Vitamin (MULTIVITAMIN WITH MINERALS) TABS tablet Take 1 tablet by mouth daily.   Yes Historical Provider, MD  norgestimate-ethinyl estradiol (ORTHO-CYCLEN,SPRINTEC,PREVIFEM) 0.25-35 MG-MCG tablet Take 1 tablet by mouth daily.   Yes Historical Provider, MD  nortriptyline (PAMELOR) 50 MG capsule Take 25 mg by mouth at bedtime.    Yes Historical Provider, MD  QUEtiapine (SEROQUEL) 100 MG tablet Take 150 mg by mouth at bedtime.   Yes Historical Provider, MD  sertraline (ZOLOFT) 50 MG tablet Take 50 mg by mouth daily.   Yes Historical Provider, MD   tiotropium (SPIRIVA) 18 MCG inhalation capsule Place 18 mcg into inhaler and inhale daily.   Yes Historical Provider, MD  XARELTO STARTER PACK 15 & 20 MG TBPK Take 15-20 mg by mouth as directed. Take as directed on package: Start with one 15mg  tablet by mouth twice a day with food. On Day 22, switch to one 20mg  tablet once a day with food. 10/01/15  Yes Ankit Nanavati, MD   BP 137/96 mmHg  Pulse 92  Temp(Src) 98.6 F (37 C) (Oral)  Resp 16  SpO2 96%  LMP 10/05/2015 (Approximate) Physical Exam  Constitutional: She is oriented to person, place, and time. She appears well-developed and well-nourished. No distress.  HENT:  Head: Normocephalic and atraumatic.  Eyes: EOM are normal.  Neck: Normal range of motion.  Cardiovascular: Normal rate, regular rhythm and normal heart sounds.   Pulmonary/Chest: Effort normal and breath sounds normal. She exhibits tenderness.  Anterior and posterior chest wall muscular tenderness.  Abdominal: Soft. She exhibits no distension. There is no tenderness.  Musculoskeletal: Normal range of motion.  Neurological: She is alert and oriented to person, place, and time.  Skin: Skin is warm and dry.  Psychiatric: She has a normal mood and affect. Judgment normal.  Nursing note and vitals reviewed.   ED Course  Procedures (including critical care time)  DIAGNOSTIC STUDIES: Oxygen Saturation is 96% on RA,  normal by my interpretation.    COORDINATION OF CARE: 1:45 AM Discussed treatment plan with pt at bedside and pt agreed to plan.  Labs Review Labs Reviewed  BASIC METABOLIC PANEL - Abnormal; Notable for the following:    Glucose, Bld 129 (*)    All other components within normal limits  CBC - Abnormal; Notable for the following:    WBC 11.9 (*)    All other components within normal limits  HCG, SERUM, QUALITATIVE  I-STAT TROPOININ, ED    Imaging Review Dg Chest 2 View  10/19/2015  CLINICAL DATA:  Chest pain sob prior hx of prior open heart  surgery EXAM: CHEST  2 VIEW COMPARISON:  10/01/2015 plain film and CT FINDINGS: Prior median sternotomy. Lateral view degraded by patient arm position. Surgical clips which project over the lateral right hemi thorax on the frontal radiograph. Midline trachea. Mild cardiomegaly. No pleural effusion or pneumothorax. Clear lungs. IMPRESSION: No acute cardiopulmonary disease. Mild cardiomegaly. Electronically Signed   By: Jeronimo GreavesKyle  Talbot M.D.   On: 10/19/2015 23:36   I have personally reviewed and evaluated these images and lab results as part of my medical decision-making.   EKG Interpretation None      MDM   Final diagnoses:  None    Patient is a 29 year old female with history of endocarditis and recent diagnosis of PE over things getting. She reports that she's been on blood thinner at home. She said that recently she's been having increasing chest pain and shortness of breath. It really seems  musculoskeletal in natrue- worse with movement and tender to palpation.  However, given history, concern for increasing clot burden. We'll do a CT angiogram to examine. Of note patient has also had tricupsid reconstruction for endocarditis.l   I personally performed the services described in this documentation, which was scribed in my presence. The recorded information has been reviewed and is accurate.   4:18 AM Pulmonary embolism study shows no change in clot burden. Mild edema. However patient has no real shortness of breath or crackles on exam. And she sating 100% on room air. No tachypnea. I will treat this pain as if it is muscular skeletal and have her follow-up with her tricuspid valve physician this week.  Not concerned about recurrent endocarditis, bc she states she is clean and she has had no fevers. The pain is alos located in teh right back/right lateral chest.   Aileen Amore Randall An, MD 10/20/15 0420  Norely Schlick Randall An, MD 10/20/15 4401

## 2015-10-20 NOTE — ED Notes (Signed)
Pt verbalized understanding of d/c instructions, prescriptions, and follow-up care. No further questions/concerns, VSS, ambulatory w/ steady gait (refused wheelchair) 

## 2015-10-20 NOTE — ED Notes (Signed)
Pt transported to CT ?

## 2015-10-20 NOTE — Discharge Instructions (Signed)
Follow-up with your cardiac physician in [place] this week.   Chest Wall Pain Chest wall pain is pain in or around the bones and muscles of your chest. Sometimes, an injury causes this pain. Sometimes, the cause may not be known. This pain may take several weeks or longer to get better. HOME CARE Pay attention to any changes in your symptoms. Take these actions to help with your pain:  Rest as told by your doctor.  Avoid activities that cause pain. Try not to use your chest, belly (abdominal), or side muscles to lift heavy things.  If directed, apply ice to the painful area:  Put ice in a plastic bag.  Place a towel between your skin and the bag.  Leave the ice on for 20 minutes, 2-3 times per day.  Take over-the-counter and prescription medicines only as told by your doctor.  Do not use tobacco products, including cigarettes, chewing tobacco, and e-cigarettes. If you need help quitting, ask your doctor.  Keep all follow-up visits as told by your doctor. This is important. GET HELP IF:  You have a fever.  Your chest pain gets worse.  You have new symptoms. GET HELP RIGHT AWAY IF:  You feel sick to your stomach (nauseous) or you throw up (vomit).  You feel sweaty or light-headed.  You have a cough with phlegm (sputum) or you cough up blood.  You are short of breath.   This information is not intended to replace advice given to you by your health care provider. Make sure you discuss any questions you have with your health care provider.   Document Released: 04/10/2008 Document Revised: 07/14/2015 Document Reviewed: 01/18/2015 Elsevier Interactive Patient Education [employer]2016 [employer] Inc[employer]

## 2015-11-22 ENCOUNTER — Other Ambulatory Visit: Payer: Self-pay | Admitting: Student

## 2015-11-22 DIAGNOSIS — B182 Chronic viral hepatitis C: Secondary | ICD-10-CM

## 2015-11-29 ENCOUNTER — Ambulatory Visit: Payer: BLUE CROSS/BLUE SHIELD

## 2015-12-19 ENCOUNTER — Encounter (HOSPITAL_COMMUNITY): Payer: Self-pay

## 2015-12-19 ENCOUNTER — Emergency Department (HOSPITAL_COMMUNITY)
Admission: EM | Admit: 2015-12-19 | Discharge: 2015-12-19 | Disposition: A | Discharge status: Home / Self Care | Payer: Medicare Other | Attending: Emergency Medicine | Admitting: Emergency Medicine

## 2015-12-19 ENCOUNTER — Emergency Department (HOSPITAL_COMMUNITY): Payer: Medicare Other

## 2015-12-19 DIAGNOSIS — Z7951 Long term (current) use of inhaled steroids: Secondary | ICD-10-CM | POA: Insufficient documentation

## 2015-12-19 DIAGNOSIS — R252 Cramp and spasm: Secondary | ICD-10-CM | POA: Diagnosis not present

## 2015-12-19 DIAGNOSIS — J069 Acute upper respiratory infection, unspecified: Secondary | ICD-10-CM | POA: Diagnosis not present

## 2015-12-19 DIAGNOSIS — Z791 Long term (current) use of non-steroidal anti-inflammatories (NSAID): Secondary | ICD-10-CM | POA: Diagnosis not present

## 2015-12-19 DIAGNOSIS — Z79818 Long term (current) use of other agents affecting estrogen receptors and estrogen levels: Secondary | ICD-10-CM | POA: Diagnosis not present

## 2015-12-19 DIAGNOSIS — Z79899 Other long term (current) drug therapy: Secondary | ICD-10-CM | POA: Insufficient documentation

## 2015-12-19 DIAGNOSIS — R233 Spontaneous ecchymoses: Secondary | ICD-10-CM

## 2015-12-19 DIAGNOSIS — F17210 Nicotine dependence, cigarettes, uncomplicated: Secondary | ICD-10-CM | POA: Diagnosis not present

## 2015-12-19 DIAGNOSIS — Z7901 Long term (current) use of anticoagulants: Secondary | ICD-10-CM | POA: Diagnosis not present

## 2015-12-19 DIAGNOSIS — Z8679 Personal history of other diseases of the circulatory system: Secondary | ICD-10-CM | POA: Insufficient documentation

## 2015-12-19 DIAGNOSIS — G629 Polyneuropathy, unspecified: Secondary | ICD-10-CM | POA: Diagnosis not present

## 2015-12-19 DIAGNOSIS — Z7982 Long term (current) use of aspirin: Secondary | ICD-10-CM | POA: Diagnosis not present

## 2015-12-19 DIAGNOSIS — R0602 Shortness of breath: Secondary | ICD-10-CM | POA: Diagnosis present

## 2015-12-19 DIAGNOSIS — R21 Rash and other nonspecific skin eruption: Secondary | ICD-10-CM | POA: Insufficient documentation

## 2015-12-19 LAB — CBC WITH DIFFERENTIAL/PLATELET
BASOS ABS: 0 10*3/uL (ref 0.0–0.1)
BASOS PCT: 0 %
Eosinophils Absolute: 0 10*3/uL (ref 0.0–0.7)
Eosinophils Relative: 0 %
HEMATOCRIT: 43.4 % (ref 36.0–46.0)
HEMOGLOBIN: 14.2 g/dL (ref 12.0–15.0)
Lymphocytes Relative: 20 %
Lymphs Abs: 1.1 10*3/uL (ref 0.7–4.0)
MCH: 29.5 pg (ref 26.0–34.0)
MCHC: 32.7 g/dL (ref 30.0–36.0)
MCV: 90.2 fL (ref 78.0–100.0)
MONOS PCT: 10 %
Monocytes Absolute: 0.6 10*3/uL (ref 0.1–1.0)
NEUTROS ABS: 3.9 10*3/uL (ref 1.7–7.7)
NEUTROS PCT: 70 %
Platelets: 160 10*3/uL (ref 150–400)
RBC: 4.81 MIL/uL (ref 3.87–5.11)
RDW: 12.7 % (ref 11.5–15.5)
WBC: 5.6 10*3/uL (ref 4.0–10.5)

## 2015-12-19 LAB — PROTIME-INR
INR: 1.09 (ref 0.00–1.49)
PROTHROMBIN TIME: 14.3 s (ref 11.6–15.2)

## 2015-12-19 LAB — BASIC METABOLIC PANEL
ANION GAP: 13 (ref 5–15)
BUN: 10 mg/dL (ref 6–20)
CHLORIDE: 106 mmol/L (ref 101–111)
CO2: 22 mmol/L (ref 22–32)
Calcium: 9.5 mg/dL (ref 8.9–10.3)
Creatinine, Ser: 0.89 mg/dL (ref 0.44–1.00)
GFR calc non Af Amer: >60 mL/min (ref >60)
Glucose, Bld: 110 mg/dL — ABNORMAL HIGH (ref 65–99)
POTASSIUM: 4 mmol/L (ref 3.5–5.1)
SODIUM: 141 mmol/L (ref 135–145)

## 2015-12-19 LAB — POC URINE PREG, ED: PREG TEST UR: NEGATIVE

## 2015-12-19 LAB — I-STAT TROPONIN, ED: Troponin i, poc: 0 ng/mL (ref 0.00–0.08)

## 2015-12-19 MED ORDER — GUAIFENESIN 100 MG/5ML PO LIQD: 100.0000 mg | ORAL | Status: DC | PRN

## 2015-12-19 MED ORDER — BENZONATATE 100 MG PO CAPS: 100.0000 mg | ORAL_CAPSULE | Freq: Three times a day (TID) | ORAL | Status: DC

## 2015-12-19 NOTE — ED Notes (Signed)
IV team remains at bedside 

## 2015-12-19 NOTE — Discharge Instructions (Signed)
Follow up with your doctor for further evaluation of your rash.  If you have persistent leg cramps, please follow up with vascular and vein specialist to evaluate for potential blood clots and blood insufficiency in your legs.  You also have cold symptoms.  Please take cough medication prescribed.   Leg Cramps Leg cramps occur when a muscle or muscles tighten and you have no control over this tightening (involuntary muscle contraction). Muscle cramps can develop in any muscle, but the most common place is in the calf muscles of the leg. Those cramps can occur during exercise or when you are at rest. Leg cramps are painful, and they may last for a few seconds to a few minutes. Cramps may return several times before they finally stop. Usually, leg cramps are not caused by a serious medical problem. In many cases, the cause is not known. Some common causes include:  Overexertion.  Overuse from repetitive motions, or doing the same thing over and over.  Remaining in a certain position for a long period of time.  Improper preparation, form, or technique while performing a sport or an activity.  Dehydration.  Injury.  Side effects of some medicines.  Abnormally low levels of the salts and ions in your blood (electrolytes), especially potassium and calcium. These levels could be low if you are taking water pills (diuretics) or if you are pregnant. HOME CARE INSTRUCTIONS Watch your condition for any changes. Taking the following actions may help to lessen any discomfort that you are feeling:  Stay well-hydrated. Drink enough fluid to keep your urine clear or pale yellow.  Try massaging, stretching, and relaxing the affected muscle. Do this for several minutes at a time.  For tight or tense muscles, use a warm towel, heating pad, or hot shower water directed to the affected area.  If you are sore or have pain after a cramp, applying ice to the affected area may relieve discomfort.  Put ice in  a plastic bag.  Place a towel between your skin and the bag.  Leave the ice on for 20 minutes, 2-3 times per day.  Avoid strenuous exercise for several days if you have been having frequent leg cramps.  Make sure that your diet includes the essential minerals for your muscles to work normally.  Take medicines only as directed by your health care provider. SEEK MEDICAL CARE IF:  Your leg cramps get more severe or more frequent, or they do not improve over time.  Your foot becomes cold, numb, or blue.   This information is not intended to replace advice given to you by your health care provider. Make sure you discuss any questions you have with your health care provider.   Document Released: 11/30/2004 Document Revised: 03/09/2015 Document Reviewed: 09/30/2014 Elsevier Interactive Patient Education [employer]

## 2015-12-19 NOTE — ED Notes (Signed)
Pt has been on Xarelto since Nov for PE.  Seen in Dec for chest pain and shortness of breath, these symptoms still have not improved.  Onset 1 week tiny red bumps on bilateral legs from hips down to ankles.  Rash does not itch.  NO new foods, products or meds.

## 2015-12-19 NOTE — ED Notes (Signed)
PA at bedside.

## 2015-12-19 NOTE — ED Provider Notes (Signed)
CSN: [id number]     Arrival date & time 12/19/15  1555 History   First MD Initiated Contact with Patient 12/19/15 2046     Chief Complaint  Patient presents with  . 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 Patient states for the past 2-3 weeks she has a non-itchy but painful rash which first noticed on her lower extremities that has progressively worsen. She denies any rectal bleeding, or bleeding gums. She denies any change in environment, food, or medication. Furthermore, she has been having sharp constant anterior chest tenderness for the past 6 months but in the past 3 days she has increasing chest pressure and tightness with lightheadedness and nonproductive cough. She complains of generalized body soreness. She maintained normal appetite. No complaints of nausea vomiting and diarrhea. No dysuria, hematuria, vaginal bleeding or vaginal discharge.  Patient states she has been drug free for more than a year. She admits to occasionally missed taking her Xarelto.    Past Medical History  Diagnosis Date  . Endocarditis     drug use  . Neuropathy [facility])    Past Surgical History  Procedure Laterality Date  . Tricuspid valve replacement      x 3; open heart surgery   History reviewed. No pertinent family history. Social History  Substance Use Topics  . Smoking status: Current Every Day Smoker -- 0.50 packs/day    Types: Cigarettes  . Smokeless tobacco: None  . Alcohol Use: No   OB History    No data available     Review of Systems  All other systems reviewed and are negative.     Allergies  Kiwi extract; Pineapple; Sulfa antibiotics; and Tramadol  Home Medications   Prior to Admission medications   Medication Sig Start Date End Date  Taking? Authorizing Provider  albuterol (PROVENTIL HFA;VENTOLIN HFA) 108 (90 BASE) MCG/ACT inhaler Inhale 2 puffs into the lungs every 6 (six) hours as needed for wheezing or shortness of breath.    Historical Provider, MD  aspirin EC 81 MG tablet Take 81 mg by mouth daily.    Historical Provider, MD  benztropine (COGENTIN) 0.5 MG tablet Take 0.5 mg by mouth at bedtime.    Historical Provider, MD  cloNIDine (CATAPRES) 0.1 MG tablet Take 0.1 mg by mouth 2 (two) times daily.    Historical Provider, MD  cyclobenzaprine (FLEXERIL) 10 MG tablet Take 1 tablet (10 mg total) by mouth 2 (two) times daily as needed for muscle spasms. 10/20/15   Courteney Lyn Mackuen, MD  diltiazem (CARDIZEM) 30 MG tablet Take 30 mg by mouth 2 (two) times daily.    Historical Provider, MD  Fluticasone Furoate-Vilanterol 100-25 MCG/INH AEPB Inhale 1 puff into the lungs daily.    Historical Provider, MD  gabapentin (NEURONTIN) 300 MG capsule Take 800 mg by mouth 4 (four) times daily.    Historical Provider, MD  hydrOXYzine (VISTARIL) 50 MG capsule Take 50 mg by mouth 3 (three) times daily as needed for itching.    Historical Provider, MD  meloxicam (MOBIC) 15 MG tablet Take 15 mg by mouth daily.    Historical Provider, MD  metoprolol tartrate (LOPRESSOR) 25 MG tablet Take 25 mg by mouth 2 (two) times daily.    Historical Provider, MD  Multiple Vitamin (MULTIVITAMIN WITH MINERALS) TABS tablet Take  1 tablet by mouth daily.    Historical Provider, MD  norgestimate-ethinyl estradiol (ORTHO-CYCLEN,SPRINTEC,PREVIFEM) 0.25-35 MG-MCG tablet Take 1 tablet by mouth daily.    Historical Provider, MD  nortriptyline (PAMELOR) 50 MG capsule Take 25 mg by mouth at bedtime.     Historical Provider, MD  oxyCODONE-acetaminophen (PERCOCET/ROXICET) 5-325 MG tablet Take 1 tablet by mouth every 6 (six) hours as needed for severe pain. 10/20/15   Courteney Lyn Mackuen, MD  QUEtiapine (SEROQUEL) 100 MG tablet Take 150 mg by mouth at bedtime.     Historical Provider, MD  sertraline (ZOLOFT) 50 MG tablet Take 50 mg by mouth daily.    Historical Provider, MD  tiotropium (SPIRIVA) 18 MCG inhalation capsule Place 18 mcg into inhaler and inhale daily.    Historical Provider, MD  XARELTO STARTER PACK 15 & 20 MG TBPK Take 15-20 mg by mouth as directed. Take as directed on package: Start with one  tablet by mouth twice a day with food. On Day 22, switch to one  tablet once a day with food. 10/01/15   Ankit Nanavati, MD   BP 130/91 mmHg  Pulse 86  Temp(Src) 98.5 F (36.9 C) (Oral)  Resp 20  SpO2 96%  LMP 11/28/2015 Physical Exam  Constitutional: She is oriented to person, place, and time. She appears well-developed and well-nourished. No distress.  Well-appearing Caucasian female in no acute discomfort.  HENT:  Head: Atraumatic.  No rash and oral mucosa. No gum bleeding.  Eyes: Conjunctivae are normal.  Neck: Neck supple.  Cardiovascular: Normal rate and regular rhythm.   Pulmonary/Chest: Effort normal and breath sounds normal. No respiratory distress. She has no wheezes. She has no rales.  Well-healing midline chest surgical scar without rash.  Abdominal: Soft. There is no tenderness.  Neurological: She is alert and oriented to person, place, and time.  Skin: Rash (Bilateral lower extremities with several petechial rash without any signs of cellulitis or abscess. No purpura.) noted.  No splinter hemorrhage, no Janeway lesion, no Osler nodes.    Psychiatric: She has a normal mood and affect.  Nursing note and vitals reviewed.   ED Course  Procedures (including critical care time) Labs Review Labs Reviewed  BASIC METABOLIC PANEL - Abnormal; Notable for the following:    Glucose, Bld 110 (*)    All other components within normal limits  CBC WITH DIFFERENTIAL/PLATELET  PROTIME-INR  POC URINE PREG, ED  Rosezena Sensor, ED    Imaging Review Dg Chest 2 View  12/19/2015  CLINICAL DATA:  Centralized chest pain for 4  days. Shortness of breath for 1 week. History of blood clot in the lungs and previous open heart surgery. EXAM: CHEST  2 VIEW COMPARISON:  10/19/2015 FINDINGS: Postoperative changes in the mediastinum. Slightly shallow inspiration. Normal heart size and pulmonary vascularity. No focal airspace disease or consolidation in the lungs. No blunting of costophrenic angles. No pneumothorax. Mediastinal contours appear intact. Mild degenerative changes in the spine. IMPRESSION: No active cardiopulmonary disease. Electronically Signed   By: Burman Nieves M.D.   On: 12/19/2015 22:38   I have personally reviewed and evaluated these images and lab results as part of my medical decision-making.   EKG Interpretation   Date/Time:  Sunday December 19 2015 21:08:29 EST Ventricular Rate:  86 PR Interval:  116 QRS Duration: 114 QT Interval:  377 QTC Calculation: 451 R Axis:   78 Text Interpretation:  Sinus rhythm Borderline short PR interval Incomplete  right bundle branch block Sinus rhythm T  wave abnormality Right bundle  branch block No significant change since last tracing Abnormal ekg  Confirmed by Gerhard Munch  MD [phone]) on 12/19/2015 9:14:19 PM      MDM   Final diagnoses:  URI (upper respiratory infection)  Petechial rash  Cramp of both lower extremities    BP 130/91 mmHg  Pulse 86  Temp(Src) 98.5 F (36.9 C) (Oral)  Resp 20  SpO2 96%  LMP 11/28/2015   11:12 PM Patient presents with tiny punctated non-blanchable rash consistence with petechiae to her lower extremities. She is currently on Xarelto. She denies having any gum bleeding, vaginal bleeding or other abnormal bleeding. The rash does not consistence with cellulitis or infectious etiology. Her labs are reassuring, normal platelets, along with normal PTT and INR.  Patient complaining of atypical chest pain. Her EKG, troponin, chest x-rays are reassuring. Given the persistent duration of her chest pain for the past several days  I have low suspicion for pain to be ACS.  Patient states she has shortness of breath however she is in no acute respiratory discomfort. No evidence of hypoxia. None exam is unremarkable. Chest x-ray is reassuring. She is currently on Xarelto which I encourage to be compliance with medication.  She c/o chronic bilateral leg cramps for over a week.  She has not had any vascular study in the past.  I discussed this with Dr. Jeraldine Loots who also saw pt and recommend outpt f/u with vascular as needed.  Return precaution discussed.    Fayrene Helper, PA-C 12/19/15 2332  Gerhard Munch, MD 12/19/15 2350

## 2015-12-19 NOTE — ED Notes (Signed)
RN attempted x 2 for IV start with blood draw;2nd RN to get IV and blood

## 2015-12-19 NOTE — ED Notes (Signed)
PA ok with pt not receiving IV start; PA to speak with pt about results

## 2016-03-16 ENCOUNTER — Other Ambulatory Visit: Payer: Self-pay | Admitting: Neurology

## 2016-03-16 DIAGNOSIS — M5441 Lumbago with sciatica, right side: Secondary | ICD-10-CM

## 2016-03-16 DIAGNOSIS — M79604 Pain in right leg: Secondary | ICD-10-CM

## 2016-04-07 ENCOUNTER — Other Ambulatory Visit: Payer: BLUE CROSS/BLUE SHIELD

## 2016-04-07 ENCOUNTER — Ambulatory Visit
Admission: RE | Admit: 2016-04-07 | Discharge: 2016-04-07 | Disposition: A | Discharge status: Home / Self Care | Payer: Medicare Other | Source: Ambulatory Visit | Attending: Neurology | Admitting: Neurology

## 2016-04-07 DIAGNOSIS — M5441 Lumbago with sciatica, right side: Secondary | ICD-10-CM

## 2016-04-07 DIAGNOSIS — M79604 Pain in right leg: Secondary | ICD-10-CM

## 2016-06-30 IMAGING — CR DG CHEST 2V
2 series · 2 of 2 positions shown · non-contrast
Comparison: 10/01/2015 plain film and CT

CLINICAL DATA: Chest pain sob prior hx of prior open heart surgery

EXAM:
CHEST  2 VIEW

[chest pa]
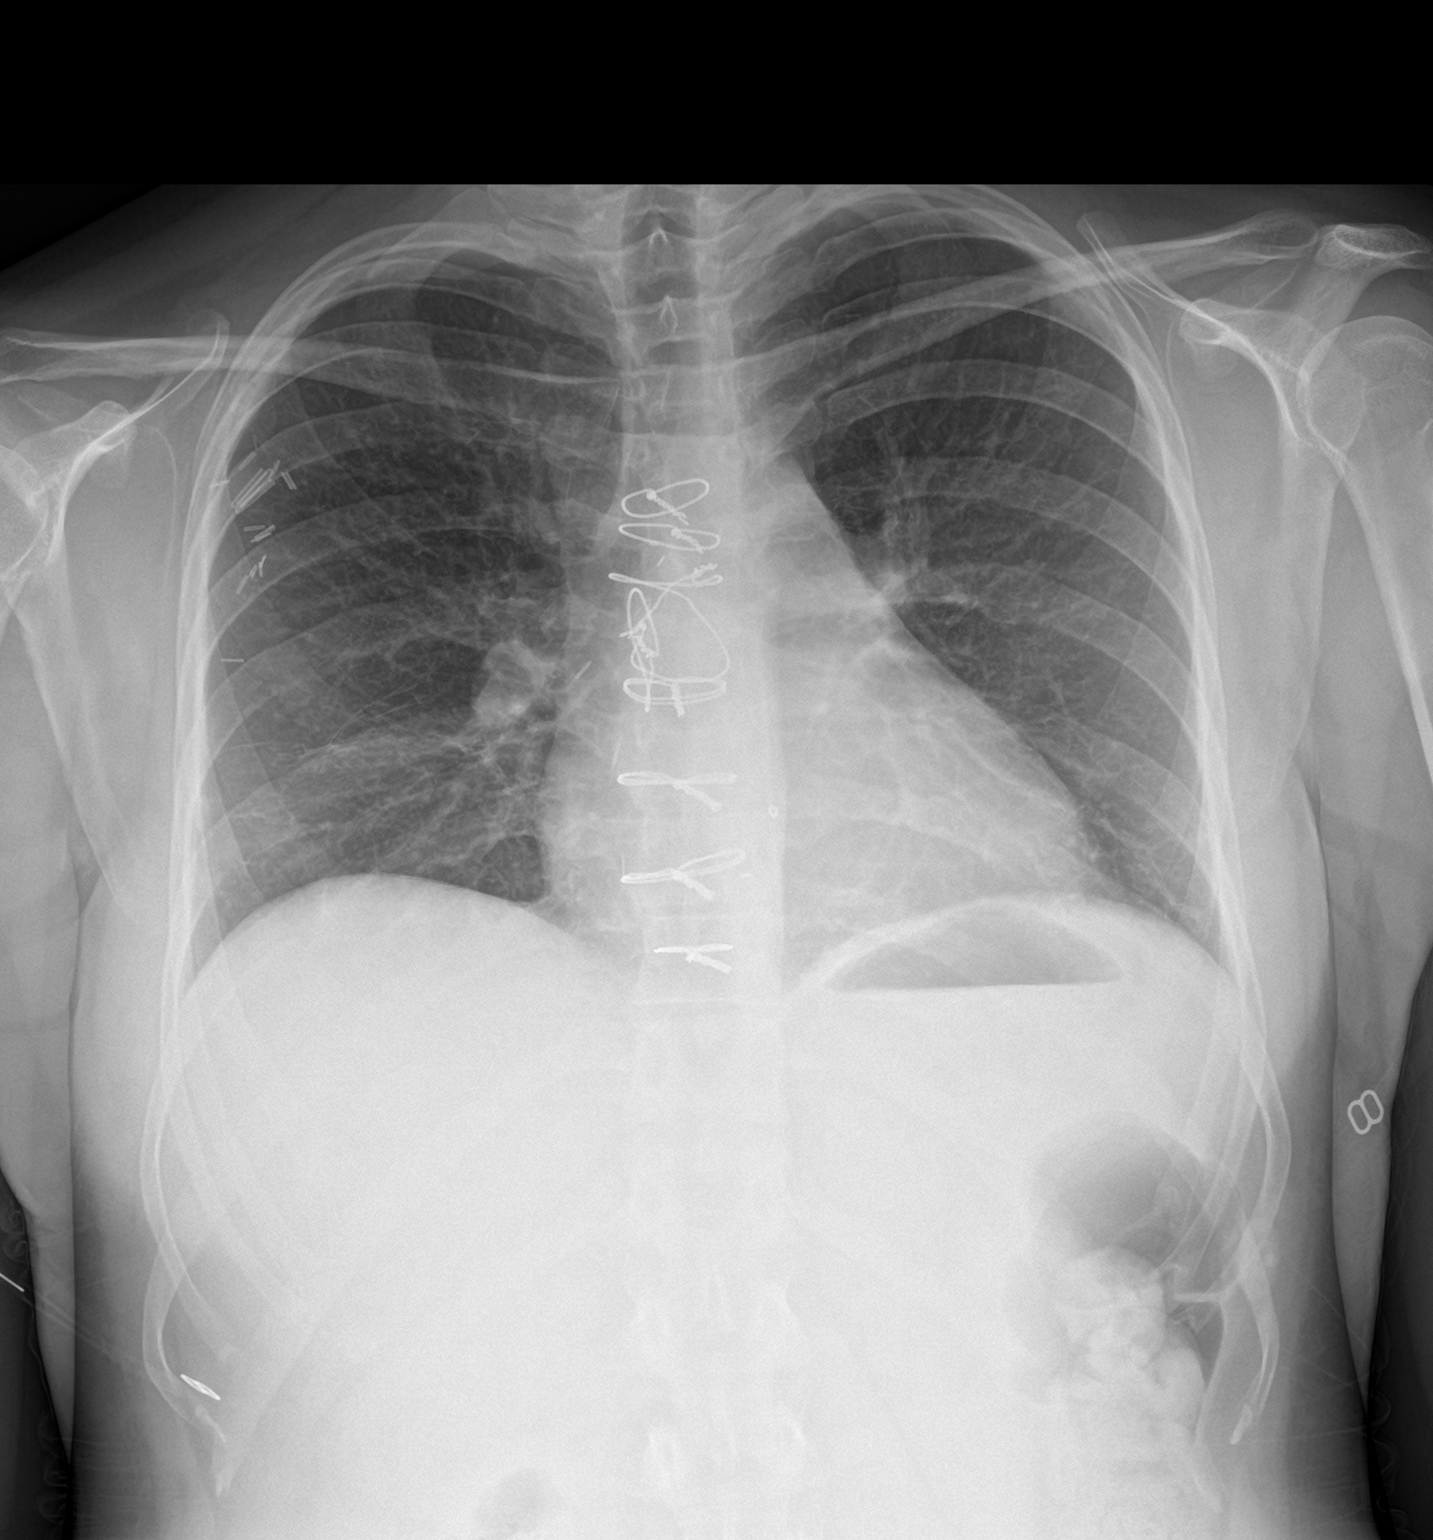

[chest lat]
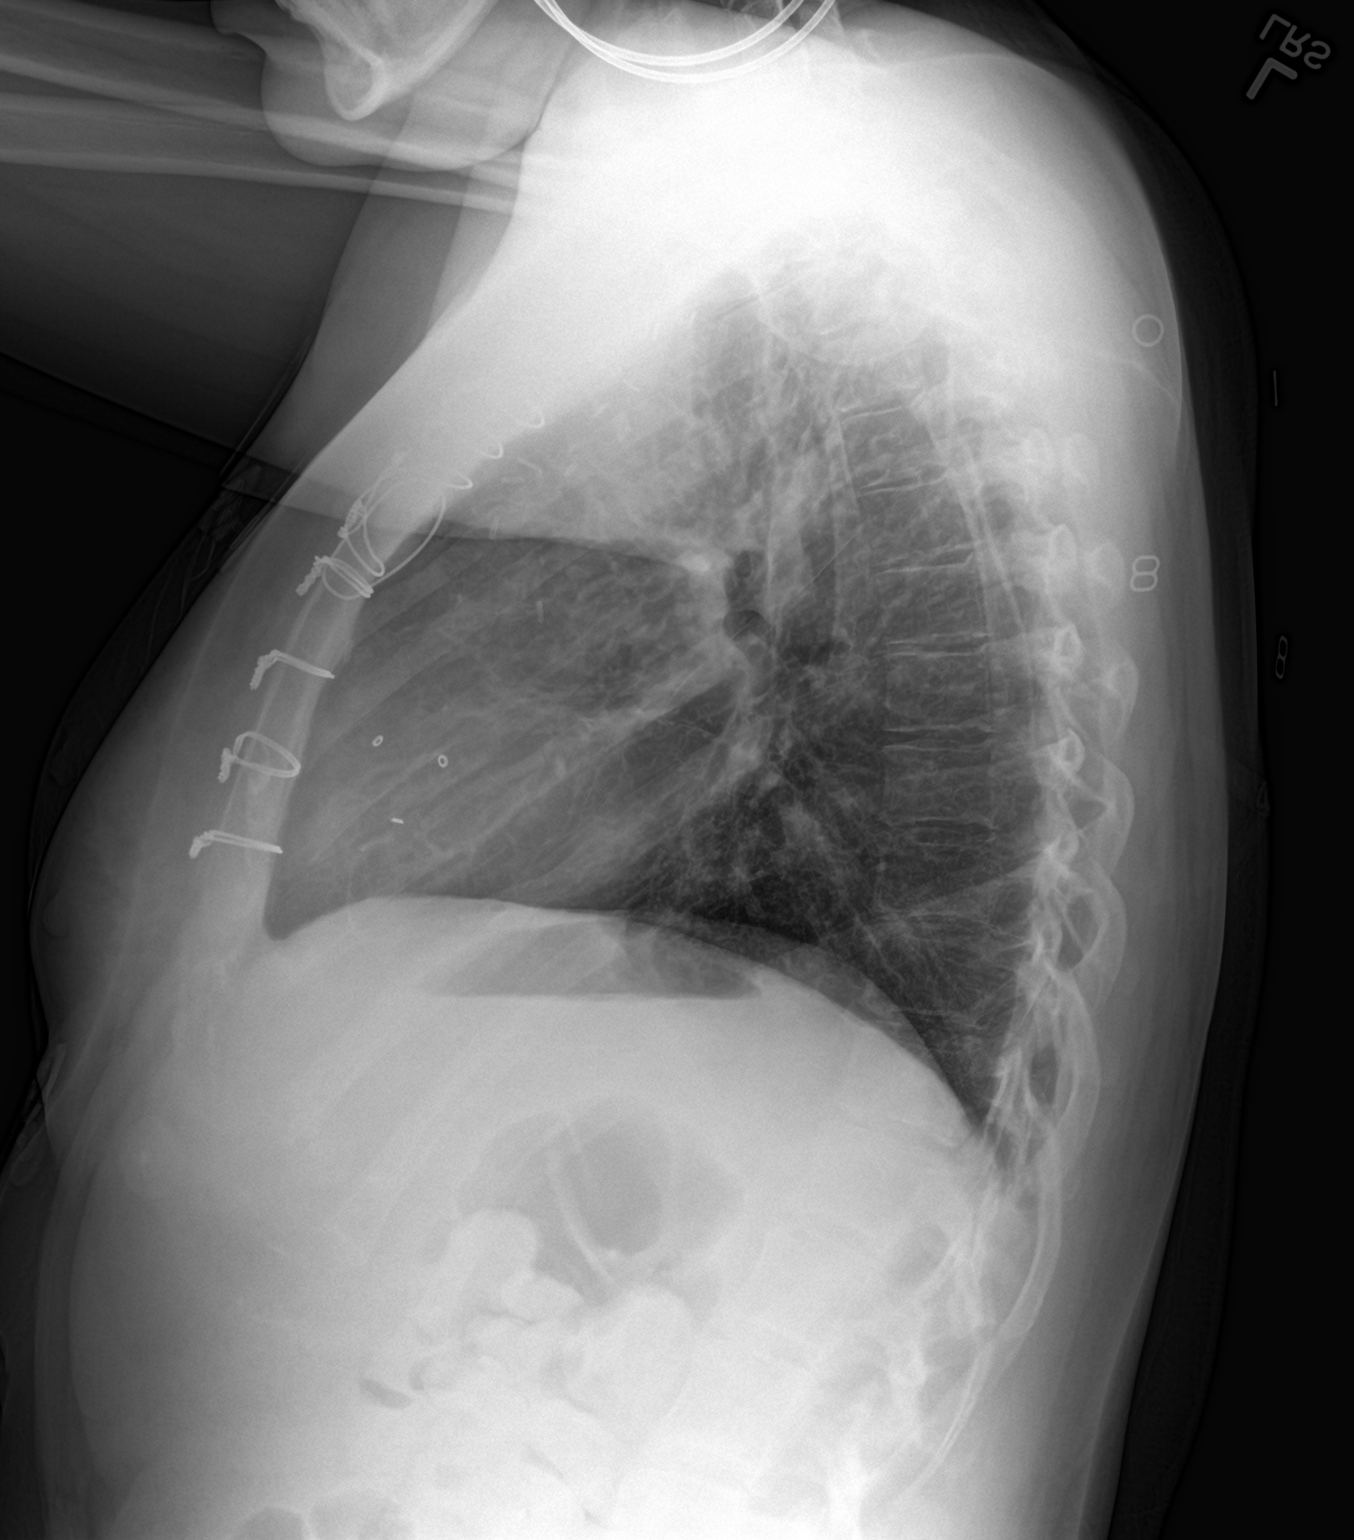

[2 of 2 positions shown; findings below may reference images not displayed]

FINDINGS: Prior median sternotomy. Lateral view degraded by patient arm
position. Surgical clips which project over the lateral right hemi
thorax on the frontal radiograph. Midline trachea. Mild
cardiomegaly. No pleural effusion or pneumothorax. Clear lungs.
IMPRESSION: No acute cardiopulmonary disease.

Mild cardiomegaly.

## 2017-09-19 LAB — OB RESULTS CONSOLE HIV ANTIBODY (ROUTINE TESTING): HIV: NONREACTIVE

## 2017-09-19 LAB — OB RESULTS CONSOLE ABO/RH: RH TYPE: POSITIVE

## 2017-09-19 LAB — OB RESULTS CONSOLE ANTIBODY SCREEN: ANTIBODY SCREEN: NEGATIVE

## 2017-09-19 LAB — OB RESULTS CONSOLE GC/CHLAMYDIA
Chlamydia: NEGATIVE
Gonorrhea: NEGATIVE

## 2017-09-19 LAB — OB RESULTS CONSOLE RPR: RPR: NONREACTIVE

## 2017-09-19 LAB — OB RESULTS CONSOLE HEPATITIS B SURFACE ANTIGEN: Hepatitis B Surface Ag: NEGATIVE

## 2017-09-19 LAB — OB RESULTS CONSOLE RUBELLA ANTIBODY, IGM: RUBELLA: IMMUNE

## 2018-03-12 ENCOUNTER — Telehealth: Payer: Self-pay | Admitting: Cardiology

## 2018-03-12 NOTE — Telephone Encounter (Signed)
Dr Jens Som spoke with dr Mindi Slicker, patient has a hx of tricuspid valve replacement and is needing a c-section. They would like a pre-op evaluation prior to surgery. Patient can be contacted at [phone]. Dr Duke Salvia has agreed to see the patient this week on her DOD day. Will call the patient to schedule.

## 2018-03-13 NOTE — Telephone Encounter (Signed)
Follow up scheduled

## 2018-03-14 ENCOUNTER — Encounter: Payer: Self-pay | Admitting: Cardiovascular Disease

## 2018-03-14 ENCOUNTER — Ambulatory Visit (INDEPENDENT_AMBULATORY_CARE_PROVIDER_SITE_OTHER): Payer: Medicare Other | Admitting: Cardiovascular Disease

## 2018-03-14 VITALS — BP 100/62 | HR 61 | Ht 64.5 in | Wt 185.0 lb

## 2018-03-14 DIAGNOSIS — Z954 Presence of other heart-valve replacement: Secondary | ICD-10-CM | POA: Diagnosis not present

## 2018-03-14 DIAGNOSIS — F319 Bipolar disorder, unspecified: Secondary | ICD-10-CM | POA: Diagnosis not present

## 2018-03-14 DIAGNOSIS — F1111 Opioid abuse, in remission: Secondary | ICD-10-CM | POA: Diagnosis not present

## 2018-03-14 DIAGNOSIS — I38 Endocarditis, valve unspecified: Secondary | ICD-10-CM

## 2018-03-14 DIAGNOSIS — I10 Essential (primary) hypertension: Secondary | ICD-10-CM | POA: Diagnosis not present

## 2018-03-14 HISTORY — DX: Bipolar disorder, unspecified: F31.9

## 2018-03-14 HISTORY — DX: Essential (primary) hypertension: I10

## 2018-03-14 HISTORY — DX: Opioid abuse, in remission: F11.11

## 2018-03-14 HISTORY — DX: Presence of other heart-valve replacement: Z95.4

## 2018-03-14 NOTE — Progress Notes (Signed)
Cardiology Office Note   Date:  03/14/2018   ID:  Kelly Mora, DOB 02/21/1986, MRN [id number]  PCP:  Kelly Shames, MD  Cardiologist:   Dr. Dorothyann Mora OB/GYN: Dr. Mindi Mora   Chief Complaint  Patient presents with  . New Patient (Initial Visit)      History of Present Illness: Kelly Mora is a 32 y.o. pregnant female with endocarditis status post tricuspid valve replacement x3, hypertension, pulmonary embolism, substance abuse history (none x2 years) and bipolar disorder, who is being seen today for the evaluation of presurgical risk assessment prior to C-section at the request of Kelly Mora, *.  Kelly Mora is a patient of Kelly Mora and last saw him 02/2018.  At that time she was doing well.  She had an echo 09/2017 that revealed LVEF 50% with mild MR, mild TR, and mild PR.  She has a history of substance abuse and endocarditis and underwent tricuspid valve replacement twice in 2014 and once in 2016.  She developed a pulmonary embolism after her second surgery.   Kelly Mora has been feeling well.  She has no chest painor edema.  She has mild shortness of breath that she attributes to pregnancy.  She has no orthopnea or PND.   She does not get much exercise.  Her blood pressure at home has been stable around 100s over 70s.  She has no lightheadedness or dizziness.  She quit smoking at the time she became pregnant but still uses a vape she has been participating in Narcotics Anonymous meetings and has abstained for over 2 years.  She is supposed to undergo elective cesarean 04/2018 at [facility].  She presents today to establish care as her primary cardiologist work at [facility].    Past Medical History:  Diagnosis Date  . Bipolar disorder (HCC) 03/14/2018  . Endocarditis    drug use  . Essential hypertension 03/14/2018  . 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 Neuropathy   . 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 TRICUSPID VALVE REPLACEMENT     x 3; open heart surgery     Current Outpatient Medications  Medication Sig Dispense Refill  . cloNIDine (CATAPRES) 0.1 MG tablet Take 0.1 mg by mouth 2 (two) times daily.    . metoprolol tartrate (LOPRESSOR) 25 MG tablet Take 25 mg by mouth 2 (two) times daily.    . sertraline (ZOLOFT) 50 MG tablet Take 50 mg by mouth daily.     No current facility-administered medications for this visit.     Allergies:   Kiwi extract; Pineapple; Pregabalin; Sulfa antibiotics; and Tramadol    Social History:  The patient  reports that she has quit smoking. Her smoking use included cigarettes. She smoked 0.50 packs per day. She has never used smokeless tobacco. She reports that she does not drink alcohol or use drugs.   Family History:  The patient's family history includes Diabetes in her father; Skin cancer in her maternal grandmother.    ROS:  Please see the history of present illness.   Otherwise, review of systems are positive for none.   All other systems are reviewed and negative.    PHYSICAL EXAM: VS:  BP 100/62   Pulse 61   Ht 5' 4.5" (1.638 m)   Wt 185 lb (83.9 kg)   BMI 31.26 kg/m  , BMI Body mass index is 31.26 kg/m. GENERAL:  Well appearing.  No acute distress.  HEENT:  Pupils equal round and reactive, fundi not visualized, oral mucosa unremarkable NECK:  No jugular venous distention, waveform within normal limits, carotid upstroke brisk and symmetric, no bruits LUNGS:  Clear to auscultation bilaterally HEART:  RRR.  PMI not displaced or sustained,S1 and S2 within normal limits, no S3, no S4, no clicks, no rubs, no murmurs ABD:  Gravid uterus. Positive bowel sounds normal in frequency in pitch, no bruits, no rebound, no guarding, no midline pulsatile mass, no hepatomegaly, no splenomegaly EXT:  2 plus pulses throughout, no edema, no cyanosis no clubbing SKIN:  No rashes no nodules NEURO:  Cranial  nerves II through XII grossly intact, motor grossly intact throughout PSYCH:  Cognitively intact, oriented to person place and time   EKG:  EKG is ordered today. The ekg ordered today demonstrates sinus rhythm.  Rate 61 bpm.  Incomplete RBBB.    Recent Labs: No results found for requested labs within last 8760 hours.    Lipid Panel No results found for: CHOL, TRIG, HDL, CHOLHDL, VLDL, LDLCALC, LDLDIRECT    Wt Readings from Last 3 Encounters:  03/14/18 185 lb (83.9 kg)  04/07/16 160 lb (72.6 kg)  09/30/15 160 lb (72.6 kg)      ASSESSMENT AND PLAN:  # S/p tricuspid valve replacement:  Kelly Mora had her tricuspid valve replaced 3 times due to bacterial endocarditis in the setting of drug abuse.  She has maintained her sobriety and is doing well.  She has no heart failure symptoms.  Her last echo 09/2017 revealed that the valve is functioning well and had no significant regurgitation.  Her LVEF is within normal limits.  It is not clear to me that she requires a cesarean.  Her cardiac condition is stable and the surgery required for a cesarean requires more of a hemodynamic strain than a pain-controlled vaginal delivery.  I am also concerned about her substance abuse history and the need for narcotics after a cesarean.  At this time she is comfortable with the idea of having a C-section, as this has been her plan all along.  We discussed the risk and benefits of both.  I think would be fine for her to deliver at [facility].  We do not have in-house cardiology coverage but can always be present if needed.  We will repeat her echo to ensure that her valve is stable.   # Hypertension: BP stable on clonidine and dilitazem.    Current medicines are reviewed at length with the patient today.  The patient does not have concerns regarding medicines.  The following changes have been made:  no change  Labs/ tests ordered today include:   Orders Placed This Encounter  Procedures  . EKG  12-Lead  . ECHOCARDIOGRAM COMPLETE     Disposition:   FU with Kelly Tangney C. Duke Salvia, MD, [facility] as needed.     Signed, Kelly Cade C. Duke Salvia, MD, [facility]  03/14/2018 1:17 PM    [facility]

## 2018-03-14 NOTE — Patient Instructions (Signed)
Medication Instructions:  Your physician recommends that you continue on your current medications as directed. Please refer to the Current Medication list given to you today.  Labwork: NONE  Testing/Procedures: Your physician has requested that you have an echocardiogram. Echocardiography is a painless test that uses sound waves to create images of your heart. It provides your doctor with information about the size and shape of your heart and how well your heart's chambers and valves are working. This procedure takes approximately one hour. There are no restrictions for this procedure. [facility] AT [address]  Follow-Up: AS NEEDED   If you need a refill on your cardiac medications before your next appointment, please call your pharmacy.

## 2018-03-19 ENCOUNTER — Encounter (HOSPITAL_COMMUNITY): Payer: Self-pay | Admitting: *Deleted

## 2018-03-20 ENCOUNTER — Ambulatory Visit (HOSPITAL_BASED_OUTPATIENT_CLINIC_OR_DEPARTMENT_OTHER): Payer: Medicare Other

## 2018-03-20 ENCOUNTER — Other Ambulatory Visit: Payer: Self-pay

## 2018-03-20 DIAGNOSIS — I38 Endocarditis, valve unspecified: Secondary | ICD-10-CM

## 2018-03-20 DIAGNOSIS — Z953 Presence of xenogenic heart valve: Secondary | ICD-10-CM | POA: Insufficient documentation

## 2018-03-20 DIAGNOSIS — I081 Rheumatic disorders of both mitral and tricuspid valves: Secondary | ICD-10-CM

## 2018-03-20 DIAGNOSIS — Z86711 Personal history of pulmonary embolism: Secondary | ICD-10-CM | POA: Insufficient documentation

## 2018-03-20 NOTE — Progress Notes (Unsigned)
11

## 2018-03-21 ENCOUNTER — Inpatient Hospital Stay (HOSPITAL_COMMUNITY)
Admission: EM | Admit: 2018-03-21 | Discharge: 2018-03-25 | DRG: 786 | Disposition: A | Discharge status: Home / Self Care | Payer: Medicare Other | Source: Ambulatory Visit | Attending: Obstetrics and Gynecology | Admitting: Obstetrics and Gynecology

## 2018-03-21 ENCOUNTER — Encounter (HOSPITAL_COMMUNITY): Payer: Self-pay

## 2018-03-21 ENCOUNTER — Inpatient Hospital Stay (HOSPITAL_COMMUNITY): Payer: Medicare Other

## 2018-03-21 ENCOUNTER — Inpatient Hospital Stay (HOSPITAL_COMMUNITY): Payer: Medicare Other | Admitting: Anesthesiology

## 2018-03-21 ENCOUNTER — Encounter (HOSPITAL_COMMUNITY)
Admission: EM | Disposition: A | Discharge status: Home / Self Care | Payer: Self-pay | Source: Ambulatory Visit | Attending: Obstetrics and Gynecology

## 2018-03-21 DIAGNOSIS — F1111 Opioid abuse, in remission: Secondary | ICD-10-CM | POA: Diagnosis present

## 2018-03-21 DIAGNOSIS — I079 Rheumatic tricuspid valve disease, unspecified: Secondary | ICD-10-CM | POA: Diagnosis not present

## 2018-03-21 DIAGNOSIS — O99334 Smoking (tobacco) complicating childbirth: Secondary | ICD-10-CM | POA: Diagnosis present

## 2018-03-21 DIAGNOSIS — O288 Other abnormal findings on antenatal screening of mother: Secondary | ICD-10-CM

## 2018-03-21 DIAGNOSIS — Z3A35 35 weeks gestation of pregnancy: Secondary | ICD-10-CM

## 2018-03-21 DIAGNOSIS — Z952 Presence of prosthetic heart valve: Secondary | ICD-10-CM

## 2018-03-21 DIAGNOSIS — O99324 Drug use complicating childbirth: Secondary | ICD-10-CM | POA: Diagnosis present

## 2018-03-21 DIAGNOSIS — O1002 Pre-existing essential hypertension complicating childbirth: Secondary | ICD-10-CM | POA: Diagnosis present

## 2018-03-21 DIAGNOSIS — F17210 Nicotine dependence, cigarettes, uncomplicated: Secondary | ICD-10-CM | POA: Diagnosis present

## 2018-03-21 DIAGNOSIS — Z954 Presence of other heart-valve replacement: Secondary | ICD-10-CM

## 2018-03-21 DIAGNOSIS — O368390 Maternal care for abnormalities of the fetal heart rate or rhythm, unspecified trimester, not applicable or unspecified: Secondary | ICD-10-CM | POA: Diagnosis present

## 2018-03-21 DIAGNOSIS — Z98891 History of uterine scar from previous surgery: Secondary | ICD-10-CM

## 2018-03-21 DIAGNOSIS — I10 Essential (primary) hypertension: Secondary | ICD-10-CM | POA: Diagnosis not present

## 2018-03-21 DIAGNOSIS — O4703 False labor before 37 completed weeks of gestation, third trimester: Secondary | ICD-10-CM | POA: Diagnosis present

## 2018-03-21 LAB — TYPE AND SCREEN
ABO/RH(D): O POS
Antibody Screen: NEGATIVE

## 2018-03-21 LAB — CBC
HCT: 38.8 % (ref 36.0–46.0)
HEMOGLOBIN: 13.6 g/dL (ref 12.0–15.0)
MCH: 32.4 pg (ref 26.0–34.0)
MCHC: 35.1 g/dL (ref 30.0–36.0)
MCV: 92.4 fL (ref 78.0–100.0)
Platelets: 159 10*3/uL (ref 150–400)
RBC: 4.2 MIL/uL (ref 3.87–5.11)
RDW: 12.9 % (ref 11.5–15.5)
WBC: 11.5 10*3/uL — ABNORMAL HIGH (ref 4.0–10.5)

## 2018-03-21 LAB — COMPREHENSIVE METABOLIC PANEL
ALK PHOS: 83 U/L (ref 38–126)
ALT: 17 U/L (ref 14–54)
AST: 32 U/L (ref 15–41)
Albumin: 3.4 g/dL — ABNORMAL LOW (ref 3.5–5.0)
Anion gap: 11 (ref 5–15)
BILIRUBIN TOTAL: 0.6 mg/dL (ref 0.3–1.2)
BUN: 13 mg/dL (ref 6–20)
CALCIUM: 8.8 mg/dL — AB (ref 8.9–10.3)
CO2: 19 mmol/L — ABNORMAL LOW (ref 22–32)
Chloride: 106 mmol/L (ref 101–111)
Creatinine, Ser: 0.5 mg/dL (ref 0.44–1.00)
GFR calc Af Amer: >60 mL/min (ref >60)
GFR calc non Af Amer: >60 mL/min (ref >60)
GLUCOSE: 75 mg/dL (ref 65–99)
Potassium: 4.2 mmol/L (ref 3.5–5.1)
Sodium: 136 mmol/L (ref 135–145)
TOTAL PROTEIN: 6.5 g/dL (ref 6.5–8.1)

## 2018-03-21 LAB — URINALYSIS, ROUTINE W REFLEX MICROSCOPIC
Bilirubin Urine: NEGATIVE
Glucose, UA: NEGATIVE mg/dL
Hgb urine dipstick: NEGATIVE
Ketones, ur: NEGATIVE mg/dL
LEUKOCYTES UA: NEGATIVE
NITRITE: NEGATIVE
Protein, ur: NEGATIVE mg/dL
Specific Gravity, Urine: 1.026 (ref 1.005–1.030)
pH: 6 (ref 5.0–8.0)

## 2018-03-21 LAB — ABO/RH: ABO/RH(D): O POS

## 2018-03-21 SURGERY — Surgical Case
Anesthesia: Spinal

## 2018-03-21 MED ORDER — DOCUSATE SODIUM 100 MG PO CAPS: 100.0000 mg | ORAL_CAPSULE | Freq: Every day | ORAL | Status: DC

## 2018-03-21 MED ORDER — SCOPOLAMINE 1 MG/3DAYS TD PT72
MEDICATED_PATCH | TRANSDERMAL | Status: AC
Start: 2018-03-21 — End: ?
  Filled 2018-03-21: qty 1

## 2018-03-21 MED ORDER — LACTATED RINGERS IV BOLUS
500.0000 mL | Freq: Once | INTRAVENOUS | Status: AC
  Administered 2018-03-21: 500 mL via INTRAVENOUS

## 2018-03-21 MED ORDER — PHENYLEPHRINE 8 MG IN D5W 100 ML (0.08MG/ML) PREMIX OPTIME
INJECTION | INTRAVENOUS | Status: DC | PRN
  Administered 2018-03-21: 40 ug/min via INTRAVENOUS

## 2018-03-21 MED ORDER — SODIUM CHLORIDE 0.9 % IR SOLN
Status: DC | PRN
  Administered 2018-03-21: 1

## 2018-03-21 MED ORDER — PHENYLEPHRINE 8 MG IN D5W 100 ML (0.08MG/ML) PREMIX OPTIME
INJECTION | INTRAVENOUS | Status: AC
  Filled 2018-03-21: qty 100

## 2018-03-21 MED ORDER — ZOLPIDEM TARTRATE 5 MG PO TABS
5.0000 mg | ORAL_TABLET | Freq: Every evening | ORAL | Status: DC | PRN
  Filled 2018-03-21: qty 1

## 2018-03-21 MED ORDER — CEFAZOLIN SODIUM-DEXTROSE 2-3 GM-%(50ML) IV SOLR
INTRAVENOUS | Status: DC | PRN
  Administered 2018-03-21: 2 g via INTRAVENOUS

## 2018-03-21 MED ORDER — PRENATAL MULTIVITAMIN CH: 1.0000 | ORAL_TABLET | Freq: Every day | ORAL | Status: DC

## 2018-03-21 MED ORDER — MORPHINE SULFATE (PF) 0.5 MG/ML IJ SOLN
INTRAMUSCULAR | Status: AC
  Filled 2018-03-21: qty 10

## 2018-03-21 MED ORDER — BETAMETHASONE SOD PHOS & ACET 6 (3-3) MG/ML IJ SUSP
12.0000 mg | INTRAMUSCULAR | Status: DC
  Administered 2018-03-21: 12 mg via INTRAMUSCULAR
  Filled 2018-03-21: qty 2

## 2018-03-21 MED ORDER — ONDANSETRON HCL 4 MG/2ML IJ SOLN
INTRAMUSCULAR | Status: DC | PRN
  Administered 2018-03-21: 4 mg via INTRAVENOUS

## 2018-03-21 MED ORDER — OXYTOCIN 10 UNIT/ML IJ SOLN
INTRAMUSCULAR | Status: AC
  Filled 2018-03-21: qty 4

## 2018-03-21 MED ORDER — OXYTOCIN 10 UNIT/ML IJ SOLN
INTRAVENOUS | Status: DC | PRN
  Administered 2018-03-21: 40 [IU] via INTRAVENOUS

## 2018-03-21 MED ORDER — DOCUSATE SODIUM 100 MG PO CAPS
100.0000 mg | ORAL_CAPSULE | Freq: Every day | ORAL | Status: DC
  Filled 2018-03-21 (×2): qty 1

## 2018-03-21 MED ORDER — CALCIUM CARBONATE ANTACID 500 MG PO CHEW: 2.0000 | CHEWABLE_TABLET | ORAL | Status: DC | PRN

## 2018-03-21 MED ORDER — ACETAMINOPHEN 325 MG PO TABS
650.0000 mg | ORAL_TABLET | ORAL | Status: DC | PRN
Start: 2018-03-21 — End: 2018-03-22

## 2018-03-21 MED ORDER — SOD CITRATE-CITRIC ACID 500-334 MG/5ML PO SOLN
ORAL | Status: AC
  Administered 2018-03-21: 30 mL
  Filled 2018-03-21: qty 15

## 2018-03-21 MED ORDER — DILTIAZEM HCL 30 MG PO TABS
30.0000 mg | ORAL_TABLET | Freq: Two times a day (BID) | ORAL | Status: DC
  Administered 2018-03-21: 30 mg via ORAL
  Filled 2018-03-21 (×2): qty 1

## 2018-03-21 MED ORDER — ONDANSETRON HCL 4 MG/2ML IJ SOLN
INTRAMUSCULAR | Status: AC
  Filled 2018-03-21: qty 2

## 2018-03-21 MED ORDER — FENTANYL CITRATE (PF) 100 MCG/2ML IJ SOLN
INTRAMUSCULAR | Status: AC
  Filled 2018-03-21: qty 2

## 2018-03-21 MED ORDER — CLONIDINE HCL 0.1 MG PO TABS
0.1000 mg | ORAL_TABLET | Freq: Two times a day (BID) | ORAL | Status: DC
  Administered 2018-03-21: 0.1 mg via ORAL
  Filled 2018-03-21 (×2): qty 1

## 2018-03-21 MED ORDER — MORPHINE SULFATE (PF) 0.5 MG/ML IJ SOLN
INTRAMUSCULAR | Status: DC | PRN
  Administered 2018-03-21: .2 mg via INTRATHECAL

## 2018-03-21 MED ORDER — FENTANYL CITRATE (PF) 100 MCG/2ML IJ SOLN
INTRAMUSCULAR | Status: DC | PRN
  Administered 2018-03-21: 10 ug via INTRATHECAL

## 2018-03-21 MED ORDER — LACTATED RINGERS IV SOLN
INTRAVENOUS | Status: DC | PRN
  Administered 2018-03-21: 23:00:00 via INTRAVENOUS

## 2018-03-21 MED ORDER — METOPROLOL TARTRATE 25 MG PO TABS
25.0000 mg | ORAL_TABLET | Freq: Two times a day (BID) | ORAL | Status: DC
  Administered 2018-03-21: 25 mg via ORAL
  Filled 2018-03-21 (×2): qty 1

## 2018-03-21 MED ORDER — ATROPINE SULFATE 0.4 MG/ML IJ SOLN
INTRAMUSCULAR | Status: AC
  Filled 2018-03-21: qty 1

## 2018-03-21 MED ORDER — BUPIVACAINE IN DEXTROSE 0.75-8.25 % IT SOLN
INTRATHECAL | Status: DC | PRN
  Administered 2018-03-21: 1.4 mL via INTRATHECAL

## 2018-03-21 MED ORDER — LACTATED RINGERS IV SOLN
INTRAVENOUS | Status: DC
  Administered 2018-03-21: 22:00:00 via INTRAVENOUS

## 2018-03-21 MED ORDER — SCOPOLAMINE 1 MG/3DAYS TD PT72
MEDICATED_PATCH | TRANSDERMAL | Status: DC | PRN
  Administered 2018-03-21: 1 via TRANSDERMAL

## 2018-03-21 SURGICAL SUPPLY — 37 items
BENZOIN TINCTURE PRP APPL 2/3 (GAUZE/BANDAGES/DRESSINGS) ×2 IMPLANT
CHLORAPREP W/TINT 26ML (MISCELLANEOUS) ×2 IMPLANT
CLAMP CORD UMBIL (MISCELLANEOUS) IMPLANT
CLOSURE STERI STRIP 1/2 X4 (GAUZE/BANDAGES/DRESSINGS) ×2 IMPLANT
CLOTH BEACON ORANGE TIMEOUT ST (SAFETY) ×2 IMPLANT
DRAPE C SECTION CLR SCREEN (DRAPES) ×2 IMPLANT
DRSG OPSITE POSTOP 4X10 (GAUZE/BANDAGES/DRESSINGS) ×2 IMPLANT
ELECT REM PT RETURN 9FT ADLT (ELECTROSURGICAL) ×2
ELECTRODE REM PT RTRN 9FT ADLT (ELECTROSURGICAL) ×1 IMPLANT
EXTRACTOR VACUUM KIWI (MISCELLANEOUS) IMPLANT
GLOVE BIO SURGEON STRL SZ 6.5 (GLOVE) ×2 IMPLANT
GLOVE BIOGEL PI IND STRL 7.0 (GLOVE) ×2 IMPLANT
GLOVE BIOGEL PI INDICATOR 7.0 (GLOVE) ×2
GOWN STRL REUS W/TWL LRG LVL3 (GOWN DISPOSABLE) ×4 IMPLANT
KIT ABG SYR 3ML LUER SLIP (SYRINGE) ×2 IMPLANT
NEEDLE HYPO 25X5/8 SAFETYGLIDE (NEEDLE) ×2 IMPLANT
NS IRRIG 1000ML POUR BTL (IV SOLUTION) ×2 IMPLANT
PACK C SECTION WH (CUSTOM PROCEDURE TRAY) ×2 IMPLANT
PAD OB MATERNITY 4.3X12.25 (PERSONAL CARE ITEMS) ×2 IMPLANT
RETRACTOR WND ALEXIS 25 LRG (MISCELLANEOUS) ×1 IMPLANT
RTRCTR C-SECT PINK 25CM LRG (MISCELLANEOUS) ×2 IMPLANT
RTRCTR WOUND ALEXIS 25CM LRG (MISCELLANEOUS) ×2
STRIP CLOSURE SKIN 1/2X4 (GAUZE/BANDAGES/DRESSINGS) IMPLANT
SUT CHROMIC 1 CTX 36 (SUTURE) ×4 IMPLANT
SUT PLAIN 0 NONE (SUTURE) IMPLANT
SUT PLAIN 2 0 (SUTURE) ×1
SUT PLAIN 2 0 XLH (SUTURE) ×2 IMPLANT
SUT PLAIN ABS 2-0 CT1 27XMFL (SUTURE) ×1 IMPLANT
SUT VIC AB 0 CT1 27 (SUTURE) ×2
SUT VIC AB 0 CT1 27XBRD ANBCTR (SUTURE) ×2 IMPLANT
SUT VIC AB 2-0 CT1 27 (SUTURE) ×1
SUT VIC AB 2-0 CT1 TAPERPNT 27 (SUTURE) ×1 IMPLANT
SUT VIC AB 3-0 CT1 27 (SUTURE)
SUT VIC AB 3-0 CT1 TAPERPNT 27 (SUTURE) IMPLANT
SUT VIC AB 4-0 KS 27 (SUTURE) ×4 IMPLANT
TOWEL OR 17X24 6PK STRL BLUE (TOWEL DISPOSABLE) ×2 IMPLANT
TRAY FOLEY W/BAG SLVR 14FR LF (SET/KITS/TRAYS/PACK) ×2 IMPLANT

## 2018-03-21 NOTE — Anesthesia Preprocedure Evaluation (Addendum)
Anesthesia Evaluation  Patient identified by MRN, date of birth, ID band Patient awake    Reviewed: Allergy & Precautions, NPO status , Patient's Chart, lab work & pertinent test results, reviewed documented beta blocker date and time   Airway Mallampati: I  TM Distance: >3 FB Neck ROM: Full    Dental  (+) Missing, Dental Advisory Given   Pulmonary Current Smoker,    breath sounds clear to auscultation       Cardiovascular hypertension, Pt. on medications and Pt. on home beta blockers (-) angina+ Valvular Problems/Murmurs (s/p Tricuspid valve replacement x3)  Rhythm:Regular Rate:Bradycardia  03/20/18 ECHO: Left ventricle: The cavity size was normal. Wall thickness normal. Systolic function was normal. EF 60% to 65%. 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,   Endo/Other  negative endocrine ROS  Renal/GU negative Renal ROS     Musculoskeletal   Abdominal   Peds  Hematology plt 159k   Anesthesia Other Findings   Reproductive/Obstetrics (+) Pregnancy                            Anesthesia Physical Anesthesia Plan  ASA: III  Anesthesia Plan: Spinal   Post-op Pain Management:    Induction:   PONV Risk Score and Plan: 1 and Treatment may vary due to age or medical condition, Ondansetron and Scopolamine patch - Pre-op  Airway Management Planned: Natural Airway  Additional Equipment:   Intra-op Plan:   Post-operative Plan:   Informed Consent: I have reviewed the patients History and Physical, chart, labs and discussed the procedure including the risks, benefits and alternatives  for the proposed anesthesia with the patient or authorized representative who has indicated his/her understanding and acceptance.   Dental advisory given  Plan Discussed with: CRNA and Surgeon  Anesthesia Plan Comments: (Plan routine monitors, SAB)        Anesthesia Quick Evaluation

## 2018-03-21 NOTE — Anesthesia Procedure Notes (Signed)
Spinal  Patient location during procedure: OR End time: 03/21/2018 10:55 PM Staffing Anesthesiologist: Jairo Ben, MD Performed: anesthesiologist  Preanesthetic Checklist Completed: patient identified, surgical consent, pre-op evaluation, timeout performed, IV checked, risks and benefits discussed and monitors and equipment checked Spinal Block Patient position: sitting Prep: site prepped and draped and DuraPrep Patient monitoring: blood pressure, continuous pulse ox, cardiac monitor and heart rate Approach: midline Location: L3-4 Injection technique: single-shot Needle Needle type: Pencan  Needle gauge: 24 G Needle length: 9 cm Assessment Sensory level: T5. Additional Notes Pt identified in Operating room.  Monitors applied. Working IV access confirmed. Sterile prep, drape lumbar spine.  1% lido local L 3,4.  #24ga Pencan into clear CSF L 3,4.  10.5 mg 0.75% Bupivacaine with dextrose, morphine, fentanyl injected with asp CSF beginning and end of injection.  Patient asymptomatic, VSS, no heme aspirated, tolerated well.  Sandford Craze, MD

## 2018-03-21 NOTE — MAU Provider Note (Signed)
History     CSN: [id number]  Arrival date and time: 03/21/18 1725   First Provider Initiated Contact with Patient 03/21/18 1830      Chief Complaint  Patient presents with  . Contractions  . Diarrhea   G1 .1 wks here with diarrhea and ctx. Diarrhea started last night. She's had 5-6 episodes today. No sick contacts. No N/V. Ctx started this afternoon. Feeling them q7-10 min. No VB or or LOF. Feeling FM today but less. Her pregnancy is complicated by hx of tricuspid valve replacement x3 and plan is for primary CS with cardiology present.   OB History    Gravida  1   Para      Term      Preterm      AB      Living        SAB      TAB      Ectopic      Multiple      Live Births              Past Medical History:  Diagnosis Date  . Bipolar disorder (HCC) 03/14/2018  . Endocarditis    drug use  . Essential hypertension 03/14/2018  . 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 Neuropathy   . 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 TRICUSPID VALVE REPLACEMENT     x 3; open heart surgery    Family History  Problem Relation Age of Onset  . Diabetes Father   . Skin cancer Maternal Grandmother     Social History   Tobacco Use  . Smoking status: Current Some Day Smoker    Packs/day: 0.50    Types: Cigarettes  . Smokeless tobacco: Never Used  . Tobacco comment: vapor  Substance Use Topics  . Alcohol use: No  . Drug use: No    Allergies:  Allergies  Allergen Reactions  . Kiwi Extract Swelling  . Pineapple Swelling  . Pregabalin Hives  . Sulfa Antibiotics Hives  . Tramadol Hives    Medications Prior to Admission  Medication Sig Dispense Refill Last Dose  . acetaminophen (TYLENOL) 325 MG tablet Take 650 mg by mouth every 6 (six) hours as needed for moderate pain.     Marland Kitchen albuterol (PROVENTIL HFA;VENTOLIN HFA) 108 (90 Base) MCG/ACT inhaler Inhale 2 puffs into the  lungs every 6 (six) hours as needed for wheezing or shortness of breath.     . cloNIDine (CATAPRES) 0.1 MG tablet Take 0.1 mg by mouth 2 (two) times daily.   Taking  . diltiazem (CARDIZEM) 30 MG tablet Take 30 mg by mouth 2 (two) times daily.     . metoprolol tartrate (LOPRESSOR) 25 MG tablet Take 25 mg by mouth 2 (two) times daily.   Taking  . Prenatal Vit-DSS-Fe Cbn-FA (PRENATAL AD PO) Take 1 tablet by mouth daily.     . sertraline (ZOLOFT) 50 MG tablet Take 50 mg by mouth daily.   Taking    Review of Systems  Constitutional: Negative for chills and fever.  Gastrointestinal: Positive for abdominal pain (ctx) and diarrhea. Negative for nausea and vomiting.  Genitourinary: Negative for vaginal bleeding and vaginal discharge.   Physical Exam   Blood pressure 127/64, pulse 62, temperature 98.5 F (36.9 C), resp. rate 18, height  (1.626 m), weight 186 lb (84.4 kg), last menstrual period 07/18/2017.  Physical Exam  Constitutional: She  is oriented to person, place, and time. She appears well-developed and well-nourished. No distress.  HENT:  Head: Normocephalic and atraumatic.  Neck: Normal range of motion.  Cardiovascular: Normal rate.  Respiratory: Effort normal. No respiratory distress.  GI: Soft. She exhibits no distension. There is no tenderness.  gravid  Genitourinary:  Genitourinary Comments: SVE 1/60/-2, vtx  Musculoskeletal: Normal range of motion.  Neurological: She is alert and oriented to person, place, and time.  Skin: Skin is warm and dry.  Psychiatric: She has a normal mood and affect.  EFM: 130 bpm, mod variability, no accels, variable decels Toco: 1-5  Results for orders placed or performed during the hospital encounter of 03/21/18 (from the past 24 hour(s))  Urinalysis, Routine w reflex microscopic     Status: Abnormal   Collection Time: 03/21/18  5:30 PM  Result Value Ref Range   Color, Urine YELLOW YELLOW   APPearance HAZY (A) CLEAR   Specific Gravity,  Urine 1.026 1.005 - 1.030   pH 6.0 5.0 - 8.0   Glucose, UA NEGATIVE NEGATIVE mg/dL   Hgb urine dipstick NEGATIVE NEGATIVE   Bilirubin Urine NEGATIVE NEGATIVE   Ketones, ur NEGATIVE NEGATIVE mg/dL   Protein, ur NEGATIVE NEGATIVE mg/dL   Nitrite NEGATIVE NEGATIVE   Leukocytes, UA NEGATIVE NEGATIVE   MAU Course  Procedures LR   MDM Labs and BPP ordered.  1903: Dr. Jackelyn Knife notified of pt presentation, FHR variables, and BPP ordered. 1955: Dr. Jackelyn Knife updated, BPP 6/8 off for breathing, AFI 14cm, continues to have recurrent deep variable decels, MD reviewing tracing remotely. Plan to admit for obs.  Assessment and Plan  [redacted] weeks gestation Non-reassuring FHT Admit to BS NPO Mngt per Dr. Jobe Gibbon, CNM 03/21/2018, 8:13 PM

## 2018-03-21 NOTE — H&P (Signed)
Kelly Mora is a 32 y.o. female, G1P0, EGA 35+ weeks with [facility] 6-19 presenting for ctx and diarrhea.  In MAU, having irreg ctx, VE 1 cm.  In MAU, FHT Cat II with some variable decels, BPP 6/8 with normal AFI.  Her ctx persisted, so because of that and Cat II tracing I admitted her for observation and she received betamethasone.  While on L&D FHT became Cat I, but then had a prolonged, 10+ minute decel after getting up and going to the bathroom that eventually responded to position change, currently Cat I.  Prenatal care complicated by h/o 3 tricuspid valve replacements, followed by [facility], most recent ECHO with EF 45-50%, they recommend delivery by c-section.  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  OB History    Gravida  1   Para      Term      Preterm      AB      Living        SAB      TAB      Ectopic      Multiple      Live Births             Past Medical History:  Diagnosis Date  . Bipolar disorder (HCC) 03/14/2018  . Endocarditis    drug use  . Essential hypertension 03/14/2018  . 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 Neuropathy   . 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 TRICUSPID VALVE REPLACEMENT     x 3; open heart surgery   Family History: family history includes Diabetes in her father; Skin cancer in her maternal grandmother. Social History:  reports that she has been smoking cigarettes.  She has been smoking about 0.50 packs per day. She has never used smokeless tobacco. She reports that she does not drink alcohol or use drugs.     Maternal Diabetes: No Genetic Screening: Declined Maternal Ultrasounds/Referrals: Normal Fetal Ultrasounds or other Referrals:  None Maternal Substance Abuse:  No Significant Maternal Medications:  Meds include: Other:  Significant Maternal Lab Results:  None Other Comments:  Toprol, clonidine, diltiazem  Review of  Systems  Respiratory: Negative.   Cardiovascular: Negative.    Maternal Medical History:  Reason for admission: Contractions.   Contractions: Frequency: irregular.   Perceived severity is mild.    Fetal activity: Perceived fetal activity is normal.    Prenatal Complications - Diabetes: none.    Dilation: 1 Effacement (%): 60 Exam by:: Kelly Mora, CNM Blood pressure 127/64, pulse 62, temperature 97.6 F (36.4 C), temperature source Oral, resp. rate 18, height  (1.626 m), weight 84.4 kg (186 lb), last menstrual period 07/18/2017. Maternal Exam:  Uterine Assessment: Contraction strength is mild.  Contraction frequency is irregular.   Abdomen: Patient reports no abdominal tenderness. Introitus: Normal vulva. Normal vagina.    Physical Exam  Vitals reviewed. Constitutional: She appears well-developed and well-nourished.  Cardiovascular: Normal rate and regular rhythm.  Respiratory: Effort normal. No respiratory distress.  GI: Soft.    Prenatal labs: ABO, Rh: O/Positive/-- (11/14 0000) Antibody: Negative (11/14 0000) Rubella: Immune (11/14 0000) RPR: Nonreactive (11/14 0000)  HBsAg: Negative (11/14 0000)  HIV: Non-reactive (11/14 0000)  GBS:   none  Assessment/Plan: IUP at 35 + weeks with threatened preterm labor with possible cervical change to 2 cm by Dr. Deretha Mora exam, recent prolonged FHR decel  now with Cat I tracing.  She has received one dose of betamethasone.  Significant cardiac history with 3 tricuspid valve replacements and h/o heroin use.  With variable decels that prompted admission, and recent prolonged decel, it is difficult to justify prolonging her pregnancy.  Discussed that if we wait, there is a significant chance she would end up needing a stat c-section, and with her cardiac history I would rather do her delivery under more controlled circumstances.  Her cardiologist has recommend she have a c-section, so procedure and risks discussed.  Dr. Jean Mora  has pointed out the possibility that she might have antibodies, but I would prefer to proceed with her c-section ASAP to avoid having to do a stat c-section for another prolonged decel.    Kelly Mora 03/21/2018, 10:31 PM

## 2018-03-21 NOTE — MAU Note (Signed)
Pt startd she started having diarrhea today and then started having ctx /cramping q 10 min. reports decreased fetal movement this afternoon.

## 2018-03-22 ENCOUNTER — Other Ambulatory Visit: Payer: Self-pay

## 2018-03-22 ENCOUNTER — Encounter (HOSPITAL_COMMUNITY): Payer: Self-pay | Admitting: Obstetrics and Gynecology

## 2018-03-22 DIAGNOSIS — I10 Essential (primary) hypertension: Secondary | ICD-10-CM

## 2018-03-22 DIAGNOSIS — Z98891 History of uterine scar from previous surgery: Secondary | ICD-10-CM

## 2018-03-22 DIAGNOSIS — O368390 Maternal care for abnormalities of the fetal heart rate or rhythm, unspecified trimester, not applicable or unspecified: Secondary | ICD-10-CM | POA: Diagnosis present

## 2018-03-22 DIAGNOSIS — I079 Rheumatic tricuspid valve disease, unspecified: Secondary | ICD-10-CM

## 2018-03-22 LAB — CBC
HCT: 33.8 % — ABNORMAL LOW (ref 36.0–46.0)
Hemoglobin: 11.7 g/dL — ABNORMAL LOW (ref 12.0–15.0)
MCH: 32.1 pg (ref 26.0–34.0)
MCHC: 34.6 g/dL (ref 30.0–36.0)
MCV: 92.9 fL (ref 78.0–100.0)
PLATELETS: 144 10*3/uL — AB (ref 150–400)
RBC: 3.64 MIL/uL — ABNORMAL LOW (ref 3.87–5.11)
RDW: 12.9 % (ref 11.5–15.5)
WBC: 18.2 10*3/uL — ABNORMAL HIGH (ref 4.0–10.5)

## 2018-03-22 LAB — RPR: RPR Ser Ql: NONREACTIVE

## 2018-03-22 MED ORDER — NALBUPHINE HCL 10 MG/ML IJ SOLN: 5.0000 mg | INTRAMUSCULAR | Status: DC | PRN

## 2018-03-22 MED ORDER — ZOLPIDEM TARTRATE 5 MG PO TABS: 5.0000 mg | ORAL_TABLET | Freq: Every evening | ORAL | Status: DC | PRN

## 2018-03-22 MED ORDER — SENNOSIDES-DOCUSATE SODIUM 8.6-50 MG PO TABS
2.0000 | ORAL_TABLET | ORAL | Status: DC
  Administered 2018-03-22 – 2018-03-24 (×2): 2 via ORAL
  Filled 2018-03-22 (×3): qty 2

## 2018-03-22 MED ORDER — ACETAMINOPHEN 500 MG PO TABS
ORAL_TABLET | ORAL | Status: AC
  Administered 2018-03-22: 1000 mg via ORAL
  Filled 2018-03-22: qty 2

## 2018-03-22 MED ORDER — SIMETHICONE 80 MG PO CHEW
80.0000 mg | CHEWABLE_TABLET | ORAL | Status: DC | PRN
  Administered 2018-03-22: 80 mg via ORAL
  Filled 2018-03-22: qty 1

## 2018-03-22 MED ORDER — KETOROLAC TROMETHAMINE 30 MG/ML IJ SOLN
30.0000 mg | Freq: Once | INTRAMUSCULAR | Status: AC
  Administered 2018-03-22: 30 mg via INTRAVENOUS

## 2018-03-22 MED ORDER — TETANUS-DIPHTH-ACELL PERTUSSIS 5-2.5-18.5 LF-MCG/0.5 IM SUSP: 0.5000 mL | Freq: Once | INTRAMUSCULAR | Status: DC

## 2018-03-22 MED ORDER — MIDAZOLAM HCL 2 MG/2ML IJ SOLN: 0.5000 mg | Freq: Once | INTRAMUSCULAR | Status: DC | PRN

## 2018-03-22 MED ORDER — ENOXAPARIN SODIUM 40 MG/0.4ML ~~LOC~~ SOLN
40.0000 mg | SUBCUTANEOUS | Status: DC
  Administered 2018-03-22 – 2018-03-24 (×3): 40 mg via SUBCUTANEOUS
  Filled 2018-03-22 (×4): qty 0.4

## 2018-03-22 MED ORDER — DIPHENHYDRAMINE HCL 25 MG PO CAPS: 25.0000 mg | ORAL_CAPSULE | Freq: Four times a day (QID) | ORAL | Status: DC | PRN

## 2018-03-22 MED ORDER — OXYCODONE HCL 5 MG PO TABS
5.0000 mg | ORAL_TABLET | ORAL | Status: DC | PRN
  Administered 2018-03-22 – 2018-03-25 (×9): 5 mg via ORAL
  Filled 2018-03-22 (×9): qty 1

## 2018-03-22 MED ORDER — ALBUTEROL SULFATE (2.5 MG/3ML) 0.083% IN NEBU: 3.0000 mL | INHALATION_SOLUTION | Freq: Four times a day (QID) | RESPIRATORY_TRACT | Status: DC | PRN

## 2018-03-22 MED ORDER — IBUPROFEN 600 MG PO TABS
600.0000 mg | ORAL_TABLET | Freq: Four times a day (QID) | ORAL | Status: DC
  Administered 2018-03-22 – 2018-03-25 (×13): 600 mg via ORAL
  Filled 2018-03-22 (×13): qty 1

## 2018-03-22 MED ORDER — OXYTOCIN 40 UNITS IN LACTATED RINGERS INFUSION - SIMPLE MED: 2.5000 [IU]/h | INTRAVENOUS | Status: AC

## 2018-03-22 MED ORDER — MORPHINE SULFATE (PF) 4 MG/ML IV SOLN
INTRAVENOUS | Status: AC
  Filled 2018-03-22: qty 1

## 2018-03-22 MED ORDER — ACETAMINOPHEN 500 MG PO TABS
1000.0000 mg | ORAL_TABLET | Freq: Three times a day (TID) | ORAL | Status: DC
  Administered 2018-03-22 – 2018-03-25 (×11): 1000 mg via ORAL
  Filled 2018-03-22 (×10): qty 2

## 2018-03-22 MED ORDER — LACTATED RINGERS IV SOLN: INTRAVENOUS | Status: DC

## 2018-03-22 MED ORDER — ACETAMINOPHEN 325 MG PO TABS: 650.0000 mg | ORAL_TABLET | ORAL | Status: DC | PRN

## 2018-03-22 MED ORDER — SERTRALINE HCL 50 MG PO TABS
50.0000 mg | ORAL_TABLET | Freq: Every day | ORAL | Status: DC
  Administered 2018-03-22 – 2018-03-25 (×4): 50 mg via ORAL
  Filled 2018-03-22 (×4): qty 1

## 2018-03-22 MED ORDER — MORPHINE SULFATE (PF) 4 MG/ML IV SOLN
1.0000 mg | INTRAVENOUS | Status: DC | PRN
  Administered 2018-03-22: 2 mg via INTRAVENOUS

## 2018-03-22 MED ORDER — MAGNESIUM HYDROXIDE 400 MG/5ML PO SUSP: 30.0000 mL | ORAL | Status: DC | PRN

## 2018-03-22 MED ORDER — DILTIAZEM HCL 30 MG PO TABS
30.0000 mg | ORAL_TABLET | Freq: Two times a day (BID) | ORAL | Status: DC
  Administered 2018-03-22 – 2018-03-25 (×7): 30 mg via ORAL
  Filled 2018-03-22 (×8): qty 1

## 2018-03-22 MED ORDER — WITCH HAZEL-GLYCERIN EX PADS: 1.0000 "application " | MEDICATED_PAD | CUTANEOUS | Status: DC | PRN

## 2018-03-22 MED ORDER — METOPROLOL TARTRATE 25 MG PO TABS
25.0000 mg | ORAL_TABLET | Freq: Two times a day (BID) | ORAL | Status: DC
  Administered 2018-03-22 – 2018-03-25 (×7): 25 mg via ORAL
  Filled 2018-03-22 (×8): qty 1

## 2018-03-22 MED ORDER — DIBUCAINE 1 % RE OINT: 1.0000 "application " | TOPICAL_OINTMENT | RECTAL | Status: DC | PRN

## 2018-03-22 MED ORDER — KETOROLAC TROMETHAMINE 30 MG/ML IJ SOLN
INTRAMUSCULAR | Status: AC
Start: 2018-03-22 — End: 2018-03-22
  Administered 2018-03-22: 30 mg via INTRAVENOUS
  Filled 2018-03-22: qty 1

## 2018-03-22 MED ORDER — PRENATAL MULTIVITAMIN CH
1.0000 | ORAL_TABLET | Freq: Every day | ORAL | Status: DC
  Administered 2018-03-22 – 2018-03-24 (×3): 1 via ORAL
  Filled 2018-03-22 (×3): qty 1

## 2018-03-22 MED ORDER — PROMETHAZINE HCL 25 MG/ML IJ SOLN: 6.2500 mg | INTRAMUSCULAR | Status: DC | PRN

## 2018-03-22 MED ORDER — MENTHOL 3 MG MT LOZG: 1.0000 | LOZENGE | OROMUCOSAL | Status: DC | PRN

## 2018-03-22 MED ORDER — COCONUT OIL OIL: 1.0000 "application " | TOPICAL_OIL | Status: DC | PRN

## 2018-03-22 MED ORDER — CLONIDINE HCL 0.1 MG PO TABS
0.1000 mg | ORAL_TABLET | Freq: Two times a day (BID) | ORAL | Status: DC
  Administered 2018-03-22 – 2018-03-25 (×7): 0.1 mg via ORAL
  Filled 2018-03-22 (×7): qty 1

## 2018-03-22 MED ORDER — MEASLES, MUMPS & RUBELLA VAC ~~LOC~~ INJ
0.5000 mL | INJECTION | Freq: Once | SUBCUTANEOUS | Status: DC
  Filled 2018-03-22: qty 0.5

## 2018-03-22 NOTE — Progress Notes (Signed)
Subjective: Postpartum Day 0: Cesarean Delivery Patient reports incisional pain and tolerating PO.    Objective: Vital signs in last 24 hours: Temp:  [97.6 F (36.4 C)-98.9 F (37.2 C)] 98.9 F (37.2 C) (05/17 0744) Pulse Rate:  [43-62] 43 (05/17 0744) Resp:  [13-22] 16 (05/17 0744) BP: (99-143)/(50-66) 110/50 (05/17 0744) SpO2:  [95 %-100 %] 97 % (05/17 0800) Weight:  [84.4 kg (186 lb)] 84.4 kg (186 lb) (05/16 1745)  Physical Exam:  General: alert and no distress Lochia: appropriate Uterine Fundus: firm Incision: healing well DVT Evaluation: No evidence of DVT seen on physical exam.  Recent Labs    03/21/18 2200 03/22/18 0524  HGB 13.6 11.7*  HCT 38.8 33.8*    Assessment/Plan: Status post Cesarean section. Doing well postoperatively.  Continue current care.  Pulse and BP stable. Cardiology to stop by today - appreciate input.   Sugars watched closely, no abx - baby in NICU.  Kelly Mora 03/22/2018, 8:36 AM

## 2018-03-22 NOTE — Consult Note (Addendum)
Cardiology Consultation:   Patient ID: Kelly Mora; [id number]; Nov 25, 1985   Admit date: 03/21/2018 Date of Consult: 03/22/2018  Primary Care Provider: Leotis Shames, MD Primary Cardiologist: Dr Duke Salvia, 03/14/2018 Dr Juliann Pares, [facility] in [place], 02/06/2018 Primary Electrophysiologist:  n/a   Patient Profile:   Kelly Mora is a 32 y.o. 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  History of Present Illness:   Kelly Mora was admitted 05/16 at 35 weeks after having developing contractions as well as diarrhea. She was having variable decels in FHR. She had been previously seen by Dr Juliann Pares and C-section recommended.  The contractions continued and the baby began having longer periods of deceleration. Dr Jackelyn Knife reviewed the data and felt the best option for mother and baby was to do an immediate C section, to avoid having to do an emergent one after another prolonged decel. She was taken to the OR very early 05/17. She tolerated the procedure well, the baby did well. He was 4 lbs, 4 oz.  [name] feels relatively well this am. She is having pain from the incision when she walks, but is fairly comfortable at rest. She denies LE edema, new DOE, orthopnea or PND. Feels her respiratory status is at baseline. No chest pain. No fevers or chills. She is vaping, but no drug or ETOH use.   Past Medical History:  Diagnosis Date  . Bipolar disorder (HCC) 03/14/2018  . Endocarditis    drug use  . Essential hypertension 03/14/2018  . 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 Neuropathy   . 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 CESAREAN SECTION N/A 03/21/2018   Procedure: PRIMARY CESAREAN SECTION;  Surgeon: Lavina Hamman, MD;  Location: [facility] BIRTHING SUITES;  Service: Obstetrics;  Laterality: N/A;  Request RNFA  . TRICUSPID VALVE REPLACEMENT     x 3; last in 2016: Mitral Mosaic 25mm - [id number]     Prior to Admission medications   Medication Sig Start Date End Date Taking? Authorizing Provider  cloNIDine (CATAPRES) 0.1 MG tablet Take 0.1 mg by mouth 2 (two) times daily.   Yes [provider]  diltiazem (CARDIZEM) 30 MG tablet Take 30 mg by mouth 2 (two) times daily.   Yes [provider]  metoprolol tartrate (LOPRESSOR) 25 MG tablet Take 25 mg by mouth 2 (two) times daily.   Yes [provider]  Prenatal Vit-DSS-Fe Cbn-FA (PRENATAL AD PO) Take 1 tablet by mouth daily.   Yes [provider]  sertraline (ZOLOFT) 50 MG tablet Take 50 mg by mouth daily.   Yes [provider]  acetaminophen (TYLENOL) 325 MG tablet Take 650 mg by mouth every 6 (six) hours as needed for moderate pain.    [provider]  albuterol (PROVENTIL HFA;VENTOLIN HFA) 108 (90 Base) MCG/ACT inhaler Inhale 2 puffs into the lungs every 6 (six) hours as needed for wheezing or shortness of breath.    [provider]    Inpatient Medications: Scheduled Meds: . acetaminophen  1,000 mg Oral Q8H  . cloNIDine  0.1 mg Oral BID  .  diltiazem  30 mg Oral BID  . enoxaparin (LOVENOX) injection  40 mg Subcutaneous Q24H  . ibuprofen  600 mg Oral Q6H  . [START ON 03/23/2018] measles, mumps and rubella vaccine  0.5 mL Subcutaneous Once  . metoprolol tartrate  25 mg Oral BID  . morphine      . prenatal multivitamin  1 tablet Oral Q1200  . [START ON 03/23/2018] senna-docusate  2 tablet Oral Q24H  . sertraline  50 mg Oral Daily  . [START ON 03/23/2018] Tdap  0.5 mL Intramuscular Once   Continuous Infusions: . lactated ringers    .  oxytocin Stopped (03/22/18 0803)   PRN Meds: acetaminophen, albuterol, coconut oil, witch hazel-glycerin **AND** dibucaine, diphenhydrAMINE, magnesium hydroxide, menthol-cetylpyridinium, nalbuphine, oxyCODONE, simethicone, zolpidem  Allergies:    Allergies  Allergen Reactions  . Kiwi Extract Swelling  . Pineapple Swelling  . Pregabalin Hives  . Sulfa Antibiotics Hives  . Tramadol Hives    Social History:   Social History   Socioeconomic History  . Marital status: Single    Spouse name: Not on file  . Number of children: Not on file  . Years of education: Not on file  . Highest education level: Not on file  Occupational History  . Not on file  Social Needs  . Financial resource strain: Not on file  . Food insecurity:    Worry: Not on file    Inability: Not on file  . Transportation needs:    Medical: Not on file    Non-medical: Not on file  Tobacco Use  . Smoking status: Current Some Day Smoker    Packs/day: 0.50    Types: Cigarettes  . Smokeless tobacco: Never Used  . Tobacco comment: vapor  Substance and Sexual Activity  . Alcohol use: No  . Drug use: No  . Sexual activity: Yes    Birth control/protection: None  Lifestyle  . Physical activity:    Days per week: Not on file    Minutes per session: Not on file  . Stress: Not on file  Relationships  . Social connections:    Talks on phone: Not on file    Gets together: Not on file    Attends religious service: Not on file    Active member of club or organization: Not on file    Attends meetings of clubs or organizations: Not on file    Relationship status: Not on file  . Intimate partner violence:    Fear of current or ex partner: Not on file    Emotionally abused: Not on file    Physically abused: Not on file    Forced sexual activity: Not on file  Other Topics Concern  . Not on file  Social History Narrative  . Not on file    Family History:   Family History  Problem Relation Age of Onset  .  Diabetes Father   . Skin cancer Maternal Grandmother    Family Status:  Family Status  Relation Name Status  . Mother  Alive  . Father  Alive  . Sister  Alive  . Brother  Alive  . MGM  Deceased  . MGF  Deceased  . PGM  Deceased  . PGF  Deceased    ROS:  Please see the history of present illness.  All other ROS reviewed and negative.     Physical Exam/Data:   Vitals:   03/22/18 0603 03/22/18 0651 03/22/18 0744 03/22/18 0800  BP: (!) 113/59 Marland Kitchen)  111/51 (!) 110/50   Pulse: (!) 46 (!) 46 (!) 43   Resp: Temp: 98.8 F (37.1 C) 98.8 F (37.1 C) 98.9 F (37.2 C)   TempSrc: Oral Oral    SpO2: 97% 98% 97% 97%  Weight:      Height:        Intake/Output Summary (Last 24 hours) at 03/22/2018 1125 Last data filed at 03/22/2018 0656 Gross per 24 hour  Intake 2170.83 ml  Output 1154 ml  Net 1016.83 ml   Filed Weights   03/21/18 1745  Weight: 186 lb (84.4 kg)   Body mass index is 31.93 kg/m.  General:  Well nourished, well developed, in no acute distress HEENT: normal Lymph: no adenopathy Neck: no JVD Endocrine:  No thryomegaly Vascular: No carotid bruits; 4/4 extremity pulses 2+, without bruits  Cardiac:  normal S1, S2; RRR; sys and diast murmurs noted Lungs:  clear to auscultation bilaterally, no wheezing, rhonchi or rales  Abd: soft, + tender, no hepatomegaly  Ext: no edema Musculoskeletal:  No deformities, BUE and BLE strength normal and equal Skin: warm and dry  Neuro:  CNs 2-12 intact, no focal abnormalities noted Psych:  Normal affect   EKG:  The EKG was personally reviewed and demonstrates:  03/14/2018 SR, HR 62, incomplete RBBB Telemetry:  Telemetry was personally reviewed and demonstrates:  n/a  Relevant CV Studies:  ECHO: 03/20/2018 - Left ventricle: The cavity size was normal. Wall thickness was   normal. Systolic function was normal. The estimated ejection   fraction was in the range of 60% to 65%. - Mitral valve: There was mild  regurgitation. - Tricuspid valve: A bioprosthesis was present.There was mild regurgitation.    Mean gradient (D): 6 mm Hg.   ECHO: 09/13/2017 Component Name Value Ref Range  LV Ejection Fraction (%) 50   Aortic Valve Stenosis Grade none   Aortic Valve Regurgitation Grade none   Aortic Valve Max Velocity (m/s) 1.1 m/sec  Mitral Valve Stenosis Grade none   Mitral Valve Regurgitation Grade mild   Tricuspid Valve Regurgitation Grade mild   Tricuspid Valve Regurgitation Max Velocity (m/s) 2.7 m/sec  Right Ventricle Systolic Pressure (mmHg) 34.8 mmHg  LV End Diastolic Diameter (cm) 4.9 cm  LV End Systolic Diameter (cm) 3.7 cm  LV Septum Wall Thickness (cm) 1.0 cm  LV Posterior Wall Thickness (cm) 1.0 cm  Left Atrium Diameter (cm) 3.7 cm  Result Narrative  CARDIOLOGY [facility] [id number] [facility] Acct #: [id number] [address] [place], [place] [address] Date: 09/12/2017 11:00 AM  Adult Female Age: 86 yrs ECHOCARDIOGRAM REPORTOutpatient  KC::KCWC STUDY:CHEST WALL TAPE: MD1:CALLWOOD, DWAYNE DENNIS  ECHO:Yes DOPPLER:YesFILE: BP: 102/60 mmHg COLOR:YesCONTRAST:NoMACHINE:PhilipsHeight: 65 in RV BIOPSY:No 3D:No SOUND QLTY:Moderate Weight: 166 lb  MEDIUM:None BSA: 1.8 m2  ___________________________________________________________________________________________  HISTORY:DOE REASON:Assess, LV function INDICATION:Murmur, unspecified [R01.1  (ICD-10-CM)]  ___________________________________________________________________________________________ ECHOCARDIOGRAPHIC MEASUREMENTS 2D DIMENSIONS AORTA ValuesNormal RangeMAIN PAValuesNormal Range Annulus:1.8 cm[2.1 - 2.5]PA Main:nm* [1.5 - 2.1] Aorta Sin:2.5 cm[2.7 - 3.3] RIGHT VENTRICLE ST Junction:nm* [2.3 - 2.9]RV Base:nm* [ < 4.2] Asc.Aorta:nm* [2.3 - 3.1] RV Mid:nm* [ < 3.5]  LEFT VENTRICLERV Length:2.7 cm[ < 8.6] LVIDd:4.9 cm[3.9 - 5.3] INFERIOR VENA CAVA LVIDs:3.7 cmMax. IVC:nm* [ <= 2.1]  FS:24.9 %[> 25]Min. IVC:nm* SWT:1.0 cm[0.5 - 0.9] ------------------ PWT:1.0 cm[0.5 - 0.9] nm* - not measured  LEFT ATRIUM LA Diam:3.7 cm[2.7 - 3.8] LA A4C Area:nm* [ < 20] LA Volume:nm* [22 - 52]  ___________________________________________________________________________________________ ECHOCARDIOGRAPHIC DESCRIPTIONS  AORTIC ROOT Size:Normal Dissection:INDETERM FOR DISSECTION  AORTIC VALVE Leaflets:TricuspidMorphology:Normal Mobility:Fully mobile  LEFT VENTRICLE Size:Normal Anterior:Normal  Contraction:NormalLateral:Normal Closest EF:50% (Estimated)Septal:Normal  LV Masses:No MassesApical:Normal  ZOX:WRUE Inferior:Normal  Posterior:Normal Dias.FxClass:N/A  MITRAL VALVE Leaflets:Normal Mobility:Fully mobile Morphology:Normal  LEFT  ATRIUM Size:NormalLA Masses:No masses  IA Septum:Normal IAS  MAIN PA Size:Normal  PULMONIC VALVE Morphology:Normal Mobility:Fully mobile  RIGHT VENTRICLE  RV Masses:No MassesSize:Normal  Free Wall:NormalContraction:Normal  TRICUSPID VALVE Leaflets:Normal Mobility:Fully mobile Morphology:BIOPROSTHETIC  RIGHT ATRIUM Size:Normal RA Other:None  RA Mass:No masses  PERICARDIUM  Fluid:No effusion  INFERIOR VENACAVA Size:Normal Normal respiratory collapse   ____________________________________________________________________ DOPPLER ECHO and OTHER SPECIAL PROCEDURES  Aortic:No AR No AS 105.9 cm/sec peak vel 4.5 mmHg peak grad   Mitral:MILD MR No MS 2.2 cm^2 by DOPPLER MV Inflow E Vel=90.2 cm/sec MV Annulus E'Vel=6.0 cm/sec E/E'Ratio=15.0  Tricuspid:MILD TR BIOPROSTHETIC TV 272.8 cm/sec peak TR vel34.8 mmHg peak RV pressure  Pulmonary:MILD PR No PS 66.5 cm/sec peak vel1.8 mmHg peak grad     ___________________________________________________________________________________________ INTERPRETATION NORMAL LEFT VENTRICULAR SYSTOLIC FUNCTION NORMAL RIGHT VENTRICULAR SYSTOLIC FUNCTION MILD VALVULAR REGURGITATION (See above) NO VALVULAR STENOSIS MILD MR, TR EF 50%   ___________________________________________________________________________________________ Electronically signed by: Dorothyann Peng, MD on 09/13/2017 10:20 AM Performed By: Merla Riches, RDCS Ordering Physician: Dorothyann Peng ___________________________________________________________________________________________  Other Result Information   Interface, Text Results In - 09/13/2017 10:21 AM EST                   CARDIOLOGY DEPARTMENT                      JALAILA, CARADONNA                      [facility]                         [id number]                 [facility]                     Acct #: [id number]       [address] [place], [place] [address]           Date: 09/12/2017 11:00 AM                                                              Adult Female Age: 36 yrs                   ECHOCARDIOGRAM REPORT                      Outpatient                                                              KC::KCWC         STUDY:CHEST WALL  TAPE:         MD1:  CALLWOOD, DWAYNE DENNIS          ECHO:Yes   DOPPLER:Yes                FILE:         BP: 102/60 mmHg         COLOR:Yes  CONTRAST:No      MACHINE:Philips          Height: 65 in     RV BIOPSY:No         3D:No   SOUND QLTY:Moderate         Weight: 166 lb        MEDIUM:None                                           BSA: 1.8 m2  ___________________________________________________________________________________________            HISTORY:DOE             REASON:Assess, LV function         INDICATION:Murmur, unspecified [R01.1 (ICD-10-CM)]  ___________________________________________________________________________________________ ECHOCARDIOGRAPHIC MEASUREMENTS 2D DIMENSIONS AORTA             Values      Normal Range      MAIN PA          Values      Normal Range           Annulus:  1.8 cm    [2.1 - 2.5]                PA Main:  nm*       [1.5 - 2.1]         Aorta Sin:  2.5 cm    [2.7 - 3.3]       RIGHT VENTRICLE       ST Junction:  nm*       [2.3 - 2.9]                RV Base:  nm*       [ < 4.2]         Asc.Aorta:  nm*       [2.3 - 3.1]                 RV Mid:  nm*       [ < 3.5]  LEFT VENTRICLE                                        RV Length:  2.7 cm    [ < 8.6]             LVIDd:  4.9 cm    [3.9 - 5.3]       INFERIOR VENA CAVA              LVIDs:  3.7 cm                              Max. IVC:  nm*       [ <= 2.1]                FS:  24.9 %    [> 25]  Min. IVC:  nm*               SWT:  1.0 cm    [0.5 - 0.9]                   ------------------               PWT:  1.0 cm    [0.5 - 0.9]                   nm* - not measured  LEFT ATRIUM           LA Diam:  3.7 cm    [2.7 - 3.8]       LA A4C Area:  nm*       [ < 20]         LA Volume:  nm*       [22 - 52]  ___________________________________________________________________________________________ ECHOCARDIOGRAPHIC DESCRIPTIONS  AORTIC ROOT         Size:Normal   Dissection:INDETERM FOR DISSECTION  AORTIC VALVE     Leaflets:Tricuspid        Morphology:Normal     Mobility:Fully mobile  LEFT VENTRICLE         Size:Normal             Anterior:Normal  Contraction:Normal              Lateral:Normal   Closest EF:50% (Estimated)      Septal:Normal    LV Masses:No Masses            Apical:Normal          ZOX:WRUE               Inferior:Normal                                Posterior:Normal Dias.FxClass:N/A  MITRAL VALVE     Leaflets:Normal             Mobility:Fully mobile   Morphology:Normal  LEFT ATRIUM         Size:Normal            LA Masses:No masses    IA Septum:Normal IAS  MAIN PA         Size:Normal  PULMONIC VALVE   Morphology:Normal             Mobility:Fully mobile  RIGHT VENTRICLE    RV Masses:No Masses              Size:Normal    Free Wall:Normal          Contraction:Normal  TRICUSPID VALVE     Leaflets:Normal             Mobility:Fully mobile   Morphology:BIOPROSTHETIC  RIGHT ATRIUM         Size:Normal             RA Other:None      RA Mass:No masses  PERICARDIUM        Fluid:No effusion  INFERIOR VENACAVA         Size:Normal Normal respiratory collapse   ____________________________________________________________________ DOPPLER ECHO and OTHER SPECIAL PROCEDURES    Aortic:No AR                         No AS            105.9 cm/sec peak vel  4.5 mmHg peak grad     Mitral:MILD MR                       No MS                                         2.2 cm^2 by DOPPLER           MV Inflow E Vel=90.2 cm/sec   MV Annulus E'Vel=6.0 cm/sec           E/E'Ratio=15.0  Tricuspid:MILD TR                       BIOPROSTHETIC TV           272.8 cm/sec peak TR vel      34.8 mmHg peak RV pressure  Pulmonary:MILD PR                       No PS           66.5 cm/sec peak vel          1.8 mmHg peak grad     ___________________________________________________________________________________________ INTERPRETATION NORMAL LEFT VENTRICULAR SYSTOLIC FUNCTION NORMAL RIGHT VENTRICULAR SYSTOLIC FUNCTION MILD VALVULAR REGURGITATION (See above) NO VALVULAR STENOSIS MILD MR, TR EF 50%    Laboratory Data:  Chemistry Recent Labs  Lab 03/21/18 2200  NA 136  K 4.2  CL 106  CO2 19*  GLUCOSE 75  BUN 13  CREATININE 0.50  CALCIUM 8.8*  GFRNONAA >60  GFRAA >60  ANIONGAP 11    Lab Results  Component Value Date   ALT 17 03/21/2018   AST 32 03/21/2018   ALKPHOS 83 03/21/2018   BILITOT 0.6 03/21/2018   Hematology Recent Labs  Lab 03/21/18 2200 03/22/18 0524  WBC 11.5* 18.2*  RBC 4.20 3.64*  HGB 13.6 11.7*  HCT 38.8 33.8*  MCV 92.4 92.9  MCH 32.4 32.1  MCHC 35.1 34.6  RDW 12.9 12.9  PLT 159 144*    Radiology/Studies:  [place] Mfm Fetal Bpp Wo Non Stress  Result Date: 03/22/2018 ----------------------------------------------------------------------  OBSTETRICS REPORT                      (Signed Final 03/22/2018 08:24 am) ---------------------------------------------------------------------- Patient Info  ID #:       [id number]                          D.O.B.:  August 28, 1986 (32 yrs)  Name:       [name]                  Visit Date: 03/21/2018 07:49 pm ---------------------------------------------------------------------- Performed By  Performed By:     Marcellina Millin          Ref. Address:     [address]                    RD[facility]  [address]                                                             [place], [place]                                                             [address]  Attending:        Charlsie Merles MD         Location:         [facility]  Referred By:      Lawernce Pitts CNM ---------------------------------------------------------------------- Orders   #  Description                                 Code   1  [place] MFM FETAL BPP WO NON STRESS              76819.01  ----------------------------------------------------------------------   #  Ordered By               Order #        Accession #    Episode #   1  Donette Larry          [id number]      [id number]     [id number]  ---------------------------------------------------------------------- Indications   [redacted] weeks gestation of pregnancy                Z3A.35   Non-reactive NST                               O28.9  ---------------------------------------------------------------------- OB History  Gravidity:    1         Term:   0        Prem:   0        SAB:   0  TOP:          0       Ectopic:  0        Living: 0 ---------------------------------------------------------------------- Fetal Evaluation  Num Of Fetuses:     1  Fetal Heart         145  Rate(bpm):  Cardiac Activity:   Observed  Presentation:       Cephalic  Amniotic Fluid  AFI FV:      Subjectively within normal limits  AFI Sum(cm)     %Tile       Largest Pocket(cm)  14.5            52          5.5  RUQ(cm)       RLQ(cm)       LUQ(cm)        LLQ(cm)  2.2           5.5           4  2.8  Comment:    6/8 BPP. ---------------------------------------------------------------------- Biophysical Evaluation  Amniotic F.V:   Within normal limits       F. Tone:        Observed  F. Movement:    Observed                   Score:          6/8  F. Breathing:   Not Observed  ---------------------------------------------------------------------- Gestational Age  LMP:           35w 1d        Date:  07/18/17                 EDD:   04/24/18  Best:          35w 1d     Det. By:  LMP  (07/18/17)          EDD:   04/24/18 ---------------------------------------------------------------------- Impression  Intrauterine pregnancy at 35+1 weeks with nonreactive  NST/variable decelerations  Normal amniotic fluid  BPP 6/8 with no sustained fetal breathing ---------------------------------------------------------------------- Recommendations  Continue clinical evaluation and management. ----------------------------------------------------------------------                 Charlsie Merles, MD Electronically Signed Final Report   03/22/2018 08:24 am ----------------------------------------------------------------------   Assessment and Plan:   1. S/p C-section in pt w/ TVR, bioprosthetic - pt doing well after surgery - no volume overload - recent echo (05/15) w/ mild MR/TR - no acute problems w/ valve - continue current care - she has not had ABX, MD advise if they are needed  Otherwise, per OB Team Active Problems:   Threatened preterm labor, third trimester   Fetal heart rate decelerations affecting management of mother   S/P cesarean section   For questions or updates, please contact [facility] Please consult [email or web address] for contact info under Cardiology/STEMI.    Signed, Theodore Demark, PA-C  03/22/2018 11:25 AM As above, patient seen and examined.  Briefly she is a 32 year old female with past medical history of substance abuse, prior tricuspid valve replacement x3 with last being January 2016, bipolar disorder, hepatitis C, hypertension, prior pulmonary embolus and now status post C-section for evaluation of prior tricuspid valve replacement.  Patient was admitted yesterday and underwent emergent C-section last evening.  Prior to her surgery she denies dyspnea, chest  pain, palpitations or syncope.  She has been asymptomatic following her surgery as well.  Echocardiogram was repeated on May 15 and showed normal LV systolic function, mild mitral regurgitation and bioprosthetic tricuspid valve.  On exam she is not volume overloaded.  Electrocardiogram from May 9 personally reviewed.  Sinus bradycardia with RV conduction delay. 1 status post tricuspid valve replacement-patient is doing well postoperatively with no evidence of volume excess or arrhythmia.  No need for further cardiac intervention. 2 status post cesarean section-per OB/GYN. 3 history of substance abuse-patient has been clean for 2 years. 4 Hypertension-blood pressure is controlled. Continue preadmission medications.  Cardiology will see again as needed.  Please call with further questions.  Olga Millers, MD

## 2018-03-22 NOTE — Transfer of Care (Signed)
Immediate Anesthesia Transfer of Care Note  Patient: Kelly Mora  Procedure(s) Performed: PRIMARY CESAREAN SECTION (N/A )  Patient Location: PACU  Anesthesia Type:Spinal  Level of Consciousness: awake, alert  and oriented  Airway & Oxygen Therapy: Patient Spontanous Breathing  Post-op Assessment: Report given to RN and Post -op Vital signs reviewed and stable  Post vital signs: Reviewed and stable HR 51, RR 18, SaO2 95%, BP 105/57  Last Vitals:  Vitals Value Taken Time  BP    Temp    Pulse    Resp    SpO2      Last Pain:  Vitals:   03/21/18 2101  TempSrc: Oral  PainSc:       Patients Stated Pain Goal: 0 (03/21/18 1745)  Complications: No apparent anesthesia complications

## 2018-03-22 NOTE — Op Note (Signed)
Preoperative diagnosis: Intrauterine pregnancy at 35 weeks, threatened preterm labor, FHR decels Postoperative diagnosis: Same Procedure: Primary low transverse cesarean section without extensions Surgeon: Lavina Hamman M.D. Anesthesia: Spinal  Findings: Patient had normal gravid anatomy and delivered a viable female infant with Apgars of 8 and 9, 4 lbs 4 oz, arterial cord pH 7.27 Estimated blood loss: 500 cc Specimens: Placenta sent for routine pathology Complications: None  Procedure in detail: The patient was taken to the operating room and placed in the sitting position. Dr. Jean Rosenthal instilled spinal anesthesia.  She was then placed in the dorsosupine position with left tilt. Abdomen was then prepped and draped in the usual sterile fashion, and a foley catheter was inserted. The level of her anesthesia was found to be adequate. Abdomen was entered via a standard Pfannenstiel incision. Once the peritoneal cavity was entered the Alexis disposable self-retaining retractor was placed and good visualization was achieved. A 4 cm transverse incision was then made in the lower uterine segment pushing the bladder inferior. Once the uterine cavity was entered the incision was extended digitally. The fetal vertex was grasped and delivered through the incision atraumatically, nuchal cord x 2 reduced. Mouth and nares were suctioned. The remainder of the infant then delivered atraumatically. Cord was doubly clamped and cut after one minute and the infant handed to the awaiting NICU team. Cord blood was obtained, arterial cord pH was obtained. The placenta delivered spontaneously. Uterus was wiped dry with clean lap pad and all clots and debris were removed. Uterine incision was inspected and found to be free of extensions. Uterine incision was closed in 2 layers with running locking #1 Chromic for the first layer, followed by an imbricating layer also with #! Chromic. Tubes and ovaries were inspected and found to be  normal. Uterine incision was inspected and found to be hemostatic. Bleeding from serosal edges was controlled with electrocautery. The Alexis retractor was removed. Subfascial space was irrigated and made hemostatic with electrocautery. Peritoneum was closed with 2-0 Vicryl.  Fascia was closed in running fashion starting at both ends and meeting in the middle with 0 Vicryl. Subcutaneous tissue was then irrigated and made hemostatic with electrocautery, then closed with running 2-0 plain gut. Skin was closed with running 4-0 Vicryl subcuticular suture followed by steri-strips and a sterile dressing. Patient tolerated the procedure well and was taken to the recovery in stable condition. Counts were correct x2, she received Ancef 2 g IV at the beginning of the procedure and she had PAS hose on throughout the procedure.

## 2018-03-22 NOTE — Anesthesia Postprocedure Evaluation (Signed)
Anesthesia Post Note  Patient: Kelly Mora  Procedure(s) Performed: PRIMARY CESAREAN SECTION (N/A )     Patient location during evaluation: PACU Anesthesia Type: Spinal Level of consciousness: awake and alert, oriented and patient cooperative Pain management: pain level controlled Vital Signs Assessment: post-procedure vital signs reviewed and stable Respiratory status: spontaneous breathing, nonlabored ventilation and respiratory function stable Cardiovascular status: blood pressure returned to baseline and stable Postop Assessment: spinal receding, patient able to bend at knees and no apparent nausea or vomiting Anesthetic complications: no    Last Vitals:  Vitals:   03/22/18 0148 03/22/18 0300  BP: (!) 111/59 110/62  Pulse: (!) 45 (!) 48  Resp: 18 18  Temp: 36.6 C 36.7 C  SpO2: 95% 95%    Last Pain:  Vitals:   03/22/18 0300  TempSrc: Oral  PainSc: 6    Pain Goal: Patients Stated Pain Goal: 2 (03/22/18 0300)               Erling Cruz. Sorayah Schrodt

## 2018-03-23 NOTE — Progress Notes (Signed)
Subjective: Postpartum Day 1: Cesarean Delivery Patient reports incisional pain and tolerating PO.  Nl lochia, pain controlled.    Objective: Vital signs in last 24 hours: Temp:  [97.7 F (36.5 C)-99 F (37.2 C)] 98.2 F (36.8 C) (05/18 0755) Pulse Rate:  [43-51] 45 (05/18 0755) Resp:  [16-18] 18 (05/18 0755) BP: (94-117)/(51-64) 94/51 (05/18 0755) SpO2:  [97 %-99 %] 99 % (05/18 0755)  Physical Exam:  General: alert and no distress Lochia: appropriate Uterine Fundus: firm Incision: healing well DVT Evaluation: No evidence of DVT seen on physical exam.  Recent Labs    03/21/18 2200 03/22/18 0524  HGB 13.6 11.7*  HCT 38.8 33.8*    Assessment/Plan: Status post Cesarean section. Doing well postoperatively.  Continue current care.  Baby doing well in NICU Appreciate cardiology input  Shahid Flori Bovard-Stuckert 03/23/2018, 8:49 AM

## 2018-03-23 NOTE — Plan of Care (Signed)
  Problem: Pain Managment: Goal: General experience of comfort will improve Outcome: Progressing   Problem: Activity: Goal: Risk for activity intolerance will decrease Outcome: Completed/Met

## 2018-03-24 NOTE — Lactation Note (Signed)
This note was copied from a baby's chart. Lactation Consultation Note  Patient Name: Kelly Mora [id number] Date: 03/24/2018   Spoke with mother who states she desires not to pump or breastfeed because of her psychiatric medication.       Maternal Data    Feeding Feeding Type: Donor Breast Milk Nipple Type: Nfant Slow Flow (purple) Length of feed: 15 min  LATCH Score                   Interventions    Lactation Tools Discussed/Used     Consult Status      Hardie Pulley 03/24/2018, 1:57 PM

## 2018-03-24 NOTE — Progress Notes (Signed)
Subjective: Postpartum Day 2: Cesarean Delivery Patient reports incisional pain, tolerating PO and no problems voiding.  Nl lochia, pain controlled.    Objective: Vital signs in last 24 hours: Temp:  [97.8 F (36.6 C)-98.6 F (37 C)] 98.1 F (36.7 C) (05/19 0434) Pulse Rate:  [47-55] 47 (05/19 0434) Resp:  [16-18] 16 (05/19 0434) BP: (103-114)/(45-68) 104/53 (05/19 0434) SpO2:  [71 %-98 %] 98 % (05/19 0434)  Physical Exam:  General: alert and no distress Lochia: appropriate Uterine Fundus: firm Incision: healing well DVT Evaluation: No evidence of DVT seen on physical exam.  Recent Labs    03/21/18 2200 03/22/18 0524  HGB 13.6 11.7*  HCT 38.8 33.8*    Assessment/Plan: Status post Cesarean section. Doing well postoperatively. Routine PP/PO care Continue current care.  Kelly Mora 03/24/2018, 8:06 AM

## 2018-03-25 MED ORDER — IBUPROFEN 600 MG PO TABS: 600.0000 mg | ORAL_TABLET | Freq: Four times a day (QID) | ORAL | 1 refills | Status: DC | PRN

## 2018-03-25 MED ORDER — OXYCODONE HCL 5 MG PO TABS: 5.0000 mg | ORAL_TABLET | ORAL | 0 refills | Status: AC | PRN

## 2018-03-25 NOTE — Progress Notes (Signed)
Pt in NICU

## 2018-03-25 NOTE — Progress Notes (Signed)
Discharge instructions given to patient and she verbalized understanding of all instructions provided. Written copy of AVS given to patient. 

## 2018-03-25 NOTE — Progress Notes (Signed)
Pt Sleeping

## 2018-03-25 NOTE — Progress Notes (Signed)
Discharged from [facility], ambulatory. Patient left unit to see infant in NICU prior to departing from [facility].

## 2018-03-25 NOTE — Discharge Summary (Signed)
OB Discharge Summary     Patient Name: Kelly Mora DOB: 08-29-1986 MRN: [id number]  Date of admission: 03/21/2018 Delivering MD: Jackelyn Knife, TODD   Date of discharge: 03/25/2018  Admitting diagnosis: 35WKS CTX, DIARRHEA Intrauterine pregnancy: [redacted]w[redacted]d     Secondary diagnosis:  Active Problems:   Threatened preterm labor, third trimester   Fetal heart rate decelerations affecting management of mother   S/P cesarean section  Additional problems: Hx cardiac valve replacement; hx narcotic abuse      Discharge diagnosis: Preterm Pregnancy Delivered                                                                                                Post partum procedures:none  Augmentation: n/a3  Complications: None  Hospital course: Non reassuring fetal heart tones remote from spontaneous delivery - recommended cesarean delivery per pts cardiologist  Physical exam  Vitals:   03/24/18 1716 03/24/18 2026 03/25/18 0354 03/25/18 0817  BP: (!) 112/53 118/64 117/65 115/81  Pulse: (!) 55 70 63 (!) 49  Resp: Temp: 98.1 F (36.7 C) 97.9 F (36.6 C) 98.7 F (37.1 C) (!) 97.5 F (36.4 C)  TempSrc:  Oral Oral Oral  SpO2: 99% 98% 100% 100%  Weight:      Height:       General: alert, cooperative and no distress Lochia: appropriate Uterine Fundus: firm Incision: Healing well with no significant drainage, No significant erythema, Dressing is clean, dry, and intact DVT Evaluation: No evidence of DVT seen on physical exam. Labs: Lab Results  Component Value Date   WBC 18.2 (H) 03/22/2018   HGB 11.7 (L) 03/22/2018   HCT 33.8 (L) 03/22/2018   MCV 92.9 03/22/2018   PLT 144 (L) 03/22/2018   CMP Latest Ref Rng & Units 03/21/2018  Glucose 65 - 99 mg/dL 75  BUN 6 - 20 mg/dL 13  Creatinine 7.82 - 9.56 mg/dL 2.13  Sodium 086 - 578 mmol/L 136  Potassium 3.5 - 5.1 mmol/L 4.2  Chloride 101 - 111 mmol/L 106  CO2 22 - 32 mmol/L 19(L)  Calcium 8.9 - 10.3 mg/dL 4.6(N)  Total  Protein 6.5 - 8.1 g/dL 6.5  Total Bilirubin 0.3 - 1.2 mg/dL 0.6  Alkaline Phos 38 - 126 U/L 83  AST 15 - 41 U/L 32  ALT 14 - 54 U/L 17    Discharge instruction: per After Visit Summary and "Baby and Me Booklet".  After visit meds:  Allergies as of 03/25/2018      Reactions   Kiwi Extract Swelling   Pineapple Swelling   Pregabalin Hives   Sulfa Antibiotics Hives   Tramadol Hives      Medication List    STOP taking these medications   acetaminophen 325 MG tablet Commonly known as:  TYLENOL     TAKE these medications   albuterol 108 (90 Base) MCG/ACT inhaler Commonly known as:  PROVENTIL HFA;VENTOLIN HFA Inhale 2 puffs into the lungs every 6 (six) hours as needed for wheezing or shortness of breath.   cloNIDine 0.1 MG tablet Commonly known as:  CATAPRES  Take 0.1 mg by mouth 2 (two) times daily.   diltiazem 30 MG tablet Commonly known as:  CARDIZEM Take 30 mg by mouth 2 (two) times daily.   ibuprofen 600 MG tablet Commonly known as:  ADVIL,MOTRIN Take 1 tablet (600 mg total) by mouth every 6 (six) hours as needed.   metoprolol tartrate 25 MG tablet Commonly known as:  LOPRESSOR Take 25 mg by mouth 2 (two) times daily.   oxyCODONE 5 MG immediate release tablet Commonly known as:  Oxy IR/ROXICODONE Take 1 tablet (5 mg total) by mouth every 4 (four) hours as needed for up to 5 days for severe pain (pain scale 4-7).   PRENATAL AD PO Take 1 tablet by mouth daily.   sertraline 50 MG tablet Commonly known as:  ZOLOFT Take 50 mg by mouth daily.       Diet: routine diet  Activity: Advance as tolerated. Pelvic rest for 6 weeks.   Outpatient follow up:2 weeks Follow up Appt: Future Appointments  Date Time Provider Department Center  03/29/2018 10:00 AM [facility] PAT 5 [facility] None   Follow up Visit:No follow-ups on file.  Postpartum contraception: Not Discussed  Newborn Data: Live born female  Birth Weight: 4 lb 4.4 oz (1940 g) APGAR: 8, 9  Newborn Delivery    Birth date/time:  03/21/2018 23:18:00 Delivery type:  C-Section, Low Transverse C-section categorization:  Primary     Baby Feeding: Bottle Disposition:NICU   03/25/2018 Cathrine Muster, DO

## 2018-03-25 NOTE — Progress Notes (Signed)
Patient ID: Kiannah Grunow, female   DOB: [birth year], 32 y.o.   MRN: [id number] Pt doing well - reports mild redness on right of incision - no itching or discharge. Also has more tenderness on right of abdomen into back than left. She had diarrhea yesterday and felt clammy two nights ago. No fever or chills. No change in appetite. Ambulating and voiding well. Bonding well with baby in NICU.  VSS ABD - soft, ND, appropriate tenderness EXT - mild edema only, no pitting, + pulses  A/P: POD #4 s/p primary c/s due to history of cardiac valve replacement- recovering well         Likely viral GI infection - advice on BRAT diet          Discharge instructions given - f/u in two weeks for incision check and 6 weeks for pp visit

## 2018-03-25 NOTE — Discharge Instructions (Signed)
Nothing in vagina for 6 weeks.  No sex, tampons, and douching.  Other instructions as in [employer]. Keep incision clean and dry; may shower

## 2018-03-27 ENCOUNTER — Emergency Department (HOSPITAL_COMMUNITY)
Admission: EM | Admit: 2018-03-27 | Discharge: 2018-03-28 | Disposition: A | Discharge status: Home / Self Care | Payer: Medicare Other | Attending: Emergency Medicine | Admitting: Emergency Medicine

## 2018-03-27 ENCOUNTER — Encounter: Payer: Self-pay | Admitting: Emergency Medicine

## 2018-03-27 ENCOUNTER — Emergency Department (HOSPITAL_COMMUNITY): Payer: Medicare Other

## 2018-03-27 DIAGNOSIS — R609 Edema, unspecified: Secondary | ICD-10-CM

## 2018-03-27 DIAGNOSIS — R0602 Shortness of breath: Secondary | ICD-10-CM | POA: Diagnosis not present

## 2018-03-27 LAB — BASIC METABOLIC PANEL
ANION GAP: 10 (ref 5–15)
BUN: 26 mg/dL — ABNORMAL HIGH (ref 6–20)
CALCIUM: 9 mg/dL (ref 8.9–10.3)
CHLORIDE: 107 mmol/L (ref 101–111)
CO2: 25 mmol/L (ref 22–32)
Creatinine, Ser: 0.82 mg/dL (ref 0.44–1.00)
GFR calc non Af Amer: >60 mL/min (ref >60)
GLUCOSE: 101 mg/dL — AB (ref 65–99)
Potassium: 4.4 mmol/L (ref 3.5–5.1)
Sodium: 142 mmol/L (ref 135–145)

## 2018-03-27 LAB — CBC
HCT: 32.1 % — ABNORMAL LOW (ref 36.0–46.0)
HEMOGLOBIN: 10.7 g/dL — AB (ref 12.0–15.0)
MCH: 31.8 pg (ref 26.0–34.0)
MCHC: 33.3 g/dL (ref 30.0–36.0)
MCV: 95.3 fL (ref 78.0–100.0)
Platelets: 210 10*3/uL (ref 150–400)
RBC: 3.37 MIL/uL — AB (ref 3.87–5.11)
RDW: 13 % (ref 11.5–15.5)
WBC: 13 10*3/uL — ABNORMAL HIGH (ref 4.0–10.5)

## 2018-03-27 LAB — I-STAT TROPONIN, ED: TROPONIN I, POC: 0.01 ng/mL (ref 0.00–0.08)

## 2018-03-27 LAB — BRAIN NATRIURETIC PEPTIDE: B Natriuretic Peptide: 52.4 pg/mL (ref 0.0–100.0)

## 2018-03-27 NOTE — ED Triage Notes (Signed)
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

## 2018-03-28 ENCOUNTER — Encounter (HOSPITAL_COMMUNITY): Payer: Self-pay | Admitting: Obstetrics and Gynecology

## 2018-03-28 DIAGNOSIS — R609 Edema, unspecified: Secondary | ICD-10-CM | POA: Diagnosis not present

## 2018-03-28 LAB — HEPATIC FUNCTION PANEL
ALK PHOS: 79 U/L (ref 38–126)
ALT: 27 U/L (ref 14–54)
AST: 33 U/L (ref 15–41)
Albumin: 3 g/dL — ABNORMAL LOW (ref 3.5–5.0)
BILIRUBIN DIRECT: 0.2 mg/dL (ref 0.1–0.5)
BILIRUBIN INDIRECT: 0.3 mg/dL (ref 0.3–0.9)
BILIRUBIN TOTAL: 0.5 mg/dL (ref 0.3–1.2)
Total Protein: 6.4 g/dL — ABNORMAL LOW (ref 6.5–8.1)

## 2018-03-28 LAB — URINALYSIS, ROUTINE W REFLEX MICROSCOPIC
Bilirubin Urine: NEGATIVE
GLUCOSE, UA: NEGATIVE mg/dL
Ketones, ur: NEGATIVE mg/dL
Nitrite: NEGATIVE
Protein, ur: 30 mg/dL — AB
SPECIFIC GRAVITY, URINE: 1.009 (ref 1.005–1.030)
pH: 6 (ref 5.0–8.0)

## 2018-03-28 MED ORDER — SERTRALINE HCL 25 MG PO TABS: 25.0000 mg | ORAL_TABLET | Freq: Every day | ORAL | Status: DC

## 2018-03-28 MED ORDER — METOPROLOL TARTRATE 25 MG PO TABS: 25.0000 mg | ORAL_TABLET | Freq: Once | ORAL | Status: DC

## 2018-03-28 MED ORDER — FUROSEMIDE 10 MG/ML IJ SOLN
40.0000 mg | Freq: Once | INTRAMUSCULAR | Status: AC
  Administered 2018-03-28: 40 mg via INTRAMUSCULAR
  Filled 2018-03-28: qty 4

## 2018-03-28 MED ORDER — FUROSEMIDE 40 MG PO TABS: 40.0000 mg | ORAL_TABLET | Freq: Every day | ORAL | 0 refills | Status: DC

## 2018-03-28 MED ORDER — CLONIDINE HCL 0.1 MG PO TABS: 0.1000 mg | ORAL_TABLET | Freq: Once | ORAL | Status: DC

## 2018-03-28 MED ORDER — DILTIAZEM HCL 30 MG PO TABS
30.0000 mg | ORAL_TABLET | Freq: Once | ORAL | Status: DC
  Filled 2018-03-28: qty 1

## 2018-03-28 NOTE — ED Notes (Signed)
Pt states will take meds when she gets home

## 2018-03-28 NOTE — ED Provider Notes (Signed)
[facility] EMERGENCY DEPARTMENT Provider Note   CSN: [id number] Arrival date & time: 03/27/18  1853     History   Chief Complaint Chief Complaint  Patient presents with  . Leg Swelling    HPI Kelly Mora is a 32 y.o. female.  The history is provided by the patient.  She has history of endocarditis with tricuspid valve replacement and 1 week status post cesarean section comes in with 2-day history of swelling in her legs.  There is mild associated dyspnea but no chest pain, heaviness, tightness, pressure.  Pregnancy was uncomplicated, but she did deliver at [redacted] weeks gestation.  Of note, she did have an echocardiogram done prior to her C-section.  No past medical history on file.  There are no active problems to display for this patient.   ** The histories are not reviewed yet. Please review them in the "History" navigator section and refresh this SmartLink.   OB History   None      Home Medications    Prior to Admission medications   Not on File    Family History No family history on file.  Social History Social History   Tobacco Use  . Smoking status: Not on file  Substance Use Topics  . Alcohol use: Not on file  . Drug use: Not on file     Allergies   Patient has no allergy information on record.   Review of Systems Review of Systems  All other systems reviewed and are negative.    Physical Exam Updated Vital Signs BP (!) 132/105 (BP Location: Right Arm)   Pulse 75   Temp 98.5 F (36.9 C) (Oral)   Resp 14   Ht  (1.626 m)   SpO2 100%   Physical Exam  Nursing note and vitals reviewed.  32 year old female, resting comfortably and in no acute distress. Vital signs are significant for elevated blood pressure. Oxygen saturation is 100%, which is normal. Head is normocephalic and atraumatic. PERRLA, EOMI. Oropharynx is clear. Neck is nontender and supple without adenopathy or JVD. Back is nontender and there is no  CVA tenderness. Lungs are clear without rales, wheezes, or rhonchi. Chest is nontender. Heart has regular rate and rhythm with 2/6 systolic ejection murmur best heard at the upper left sternal border. Abdomen is soft, flat, nontender without masses or hepatosplenomegaly and peristalsis is normoactive. Extremities have 3+ edema, full range of motion is present.  There is 2+ presacral edema. Skin is warm and dry without rash. Neurologic: Mental status is normal, cranial nerves are intact, there are no motor or sensory deficits.  ED Treatments / Results  Labs (all labs ordered are listed, but only abnormal results are displayed) Labs Reviewed  BASIC METABOLIC PANEL - Abnormal; Notable for the following components:      Result Value   Glucose, Bld 101 (*)    BUN 26 (*)    All other components within normal limits  CBC - Abnormal; Notable for the following components:   WBC 13.0 (*)    RBC 3.37 (*)    Hemoglobin 10.7 (*)    HCT 32.1 (*)    All other components within normal limits  HEPATIC FUNCTION PANEL - Abnormal; Notable for the following components:   Total Protein 6.4 (*)    Albumin 3.0 (*)    All other components within normal limits  URINALYSIS, ROUTINE W REFLEX MICROSCOPIC - Abnormal; Notable for the following components:   APPearance HAZY (*)  Hgb urine dipstick LARGE (*)    Protein, ur 30 (*)    Leukocytes, UA LARGE (*)    WBC, UA >50 (*)    Bacteria, UA RARE (*)    All other components within normal limits  BRAIN NATRIURETIC PEPTIDE  I-STAT TROPONIN, ED    EKG EKG Interpretation  Date/Time:  Wednesday Mar 27 2018 19:52:07 EDT Ventricular Rate:  75 PR Interval:  116 QRS Duration: 96 QT Interval:  380 QTC Calculation: 424 R Axis:   66 Text Interpretation:  Normal sinus rhythm Incomplete right bundle branch block Borderline ECG No old tracing to compare Confirmed by [name] ([id number]) on 03/28/2018 12:31:24 AM   Radiology Dg Chest 2 View  Result Date:  03/27/2018 CLINICAL DATA:  Shortness of breath. EXAM: CHEST - 2 VIEW COMPARISON:  None. FINDINGS: Anchors from tricuspid valve replacement noted. Intact median sternotomy wires. Postsurgical changes in the right chest wall/right axilla. Cardiomediastinal silhouette is normal. Mediastinal contours appear intact. There is no evidence of focal airspace consolidation, pleural effusion or pneumothorax. Osseous structures are without acute abnormality. Soft tissues are grossly normal. IMPRESSION: No evidence of focal airspace consolidation or pulmonary edema. Electronically Signed   By: Ted Mcalpine M.D.   On: 03/27/2018 20:27    Procedures Procedures  Medications Ordered in ED Medications  cloNIDine (CATAPRES) tablet 0.1 mg (has no administration in time range)  diltiazem (CARDIZEM) tablet 30 mg (has no administration in time range)  metoprolol tartrate (LOPRESSOR) tablet 25 mg (has no administration in time range)  sertraline (ZOLOFT) tablet 25 mg (has no administration in time range)  furosemide (LASIX) injection 40 mg (40 mg Intramuscular Given 03/28/18 0519)     Initial Impression / Assessment and Plan / ED Course  I have reviewed the triage vital signs and the nursing notes.  Pertinent labs & imaging results that were available during my care of the patient were reviewed by me and considered in my medical decision making (see chart for details).  Peripheral edema and the postpartum period.  Blood pressure is mildly elevated, no other findings suggestive of preeclampsia.  Chest x-ray shows no evidence of cardiomegaly or pulmonary vascular congestion.  Troponin is normal and BNP is normal.  ECG shows no acute findings.  Mild anemia is present, and hemoglobin has dropped compared with before cesarean birth.  Will check urinalysis for evidence of proteinuria.  In the meantime, she is given a dose of furosemide.  Urinalysis does show small amount of proteinuria.  Transaminases are normal and  platelets are normal.  Blood pressure3 has come down with observation.  Case was discussed with Dr. Ellyn Hack who feels that she still meets the criteria for preeclampsia and they will follow her closely.  No sign at this point of postpartum cardiomyopathy.  She is discharged with a prescription for furosemide and she is to call her gynecologist's office today to make arrangements for follow-up blood pressure checks.  Final Clinical Impressions(s) / ED Diagnoses   Final diagnoses:  Peripheral edema    ED Discharge Orders        Ordered    furosemide (LASIX) 40 MG tablet  Daily     03/28/18 0726       [name], MD 03/28/18 [phone]

## 2018-03-28 NOTE — ED Notes (Addendum)
Immediately upon my entering room , family states they have been here aLL NIGHT AND BACK IN ROOM SINCE 5 , THEY WANT TO GO Home so pt can take her meds and sleep some before she has nurse come to check incision at noon , dr  Preston Fleeting In to speak to her and family explain post partum eclampsia

## 2018-03-28 NOTE — Discharge Instructions (Signed)
If you develop a headache or any problem with your vision, then go IMMEDIATELY to [facility] MAU.

## 2018-03-29 ENCOUNTER — Encounter (HOSPITAL_COMMUNITY)
Admission: RE | Admit: 2018-03-29 | Discharge: 2018-03-29 | Disposition: A | Discharge status: Home / Self Care | Payer: Medicare Other | Source: Ambulatory Visit

## 2018-04-17 ENCOUNTER — Inpatient Hospital Stay (HOSPITAL_COMMUNITY): Admit: 2018-04-17 | Payer: Medicare Other | Admitting: Obstetrics and Gynecology

## 2018-07-04 ENCOUNTER — Other Ambulatory Visit: Payer: Self-pay

## 2018-07-04 ENCOUNTER — Encounter (HOSPITAL_BASED_OUTPATIENT_CLINIC_OR_DEPARTMENT_OTHER): Payer: Self-pay

## 2018-07-04 ENCOUNTER — Emergency Department (HOSPITAL_BASED_OUTPATIENT_CLINIC_OR_DEPARTMENT_OTHER): Payer: Medicare Other

## 2018-07-04 ENCOUNTER — Emergency Department (HOSPITAL_BASED_OUTPATIENT_CLINIC_OR_DEPARTMENT_OTHER)
Admission: EM | Admit: 2018-07-04 | Discharge: 2018-07-05 | Disposition: A | Discharge status: Home / Self Care | Payer: Medicare Other | Attending: Emergency Medicine | Admitting: Emergency Medicine

## 2018-07-04 DIAGNOSIS — Z79899 Other long term (current) drug therapy: Secondary | ICD-10-CM | POA: Insufficient documentation

## 2018-07-04 DIAGNOSIS — F17210 Nicotine dependence, cigarettes, uncomplicated: Secondary | ICD-10-CM | POA: Diagnosis not present

## 2018-07-04 DIAGNOSIS — J209 Acute bronchitis, unspecified: Secondary | ICD-10-CM | POA: Diagnosis not present

## 2018-07-04 DIAGNOSIS — R0789 Other chest pain: Secondary | ICD-10-CM | POA: Diagnosis present

## 2018-07-04 DIAGNOSIS — I10 Essential (primary) hypertension: Secondary | ICD-10-CM | POA: Diagnosis not present

## 2018-07-04 LAB — CBC
HCT: 41.3 % (ref 36.0–46.0)
HEMOGLOBIN: 14 g/dL (ref 12.0–15.0)
MCH: 29 pg (ref 26.0–34.0)
MCHC: 33.9 g/dL (ref 30.0–36.0)
MCV: 85.5 fL (ref 78.0–100.0)
Platelets: 231 10*3/uL (ref 150–400)
RBC: 4.83 MIL/uL (ref 3.87–5.11)
RDW: 13.4 % (ref 11.5–15.5)
WBC: 12.7 10*3/uL — ABNORMAL HIGH (ref 4.0–10.5)

## 2018-07-04 LAB — BASIC METABOLIC PANEL
ANION GAP: 11 (ref 5–15)
BUN: 15 mg/dL (ref 6–20)
CO2: 26 mmol/L (ref 22–32)
Calcium: 9.4 mg/dL (ref 8.9–10.3)
Chloride: 101 mmol/L (ref 98–111)
Creatinine, Ser: 0.86 mg/dL (ref 0.44–1.00)
GFR calc Af Amer: >60 mL/min (ref >60)
GLUCOSE: 87 mg/dL (ref 70–99)
POTASSIUM: 4 mmol/L (ref 3.5–5.1)
Sodium: 138 mmol/L (ref 135–145)

## 2018-07-04 LAB — PREGNANCY, URINE: Preg Test, Ur: NEGATIVE

## 2018-07-04 LAB — TROPONIN I: Troponin I: <0.03 ng/mL (ref <0.03)

## 2018-07-04 LAB — D-DIMER, QUANTITATIVE: D-Dimer, Quant: 0.88 ug/mL-FEU — ABNORMAL HIGH (ref 0.00–0.50)

## 2018-07-04 NOTE — ED Notes (Signed)
EDP at bedside for IV access 

## 2018-07-04 NOTE — ED Notes (Signed)
Iv attempt x 2 , blood collected

## 2018-07-04 NOTE — ED Triage Notes (Addendum)
C/o CP x 4 days-also c/o "huge clot between my teeth" x 2 hours-NAD-steady gait

## 2018-07-04 NOTE — ED Provider Notes (Signed)
[facility] EMERGENCY DEPARTMENT Provider Note   CSN: [id number] Arrival date & time: 07/04/18  2113     History   Chief Complaint Chief Complaint  Patient presents with  . Chest Pain    HPI Kelly Mora is a 32 y.o. female.  The history is provided by the patient. No language interpreter was used.  Chest Pain     Kelly Mora is a 32 y.o. female who presents to the Emergency Department complaining of chest pain. Resents to the emergency department complaining of central chest pain described as up in nature. It is worse with deep breaths. She denies any fevers, shortness of breath, nausea, vomiting, abdominal pain. She also noted today that she began to develop bleeding between her to right upper teeth. She did not have any trauma to the area. She has not eaten since this morning. She feels like there is some mild throbbing in that area. 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 Her last surgery was in 2016. She has been clean from drugs since that time. She denies any chills. She does have constant night sweats and this is unchanged from her baseline. She is three months postpartum, not currently breast-feeding. She does have a history of DVT but is not currently anticoagulated. Past Medical History:  Diagnosis Date  . Bipolar disorder (HCC) 03/14/2018  . Endocarditis    drug use  . Essential hypertension 03/14/2018  . 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 Neuropathy   . 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 Fetal heart rate decelerations affecting management of mother 03/22/2018  . S/P cesarean section 03/22/2018  . Threatened preterm labor, third trimester 03/21/2018  . Bipolar disorder (HCC) 03/14/2018  . 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 Essential hypertension 03/14/2018  . Narcotic abuse in  remission (HCC) 03/14/2018    Past Surgical History:  Procedure Laterality Date  . CARDIAC SURGERY    . CESAREAN SECTION N/A 03/21/2018   Procedure: PRIMARY CESAREAN SECTION;  Surgeon: Lavina Hamman, MD;  Location: [facility] BIRTHING SUITES;  Service: Obstetrics;  Laterality: N/A;  Request RNFA  . TRICUSPID VALVE REPLACEMENT     x 3; last in 2016: Mitral Mosaic 25mm - [id number]     OB History    Gravida  1   Para  1   Term  0   Preterm  1   AB  0   Living  1     SAB  0   TAB  0   Ectopic  0   Multiple      Live Births  1            Home Medications    Prior to Admission medications   Medication Sig Start Date End Date Taking? Authorizing Provider  albuterol (PROVENTIL HFA;VENTOLIN HFA) 108 (90 Base) MCG/ACT inhaler Inhale 2 puffs into the lungs every 6 (six) hours as needed for wheezing or shortness of breath.    [provider]  cloNIDine (CATAPRES) 0.1 MG tablet Take 0.1 mg by mouth 2 (two) times daily.    [provider]  cloNIDine (CATAPRES) 0.1 MG tablet Take 0.1 mg by mouth 2 (two) times daily.    [provider]  diltiazem (CARDIZEM) 30 MG tablet Take 30 mg by mouth 2 (two) times daily.    [provider]  diltiazem (CARDIZEM) 30 MG tablet Take 30 mg by mouth 2 (two) times daily.    [provider]  furosemide (LASIX) 40 MG tablet Take 1 tablet (40 mg total) by mouth daily. 03/28/18   [name], MD  ibuprofen (ADVIL,MOTRIN) 600 MG tablet Take 1 tablet (600 mg total) by mouth every 6 (six) hours as needed. 03/25/18   Banga, Sharol Given, DO  ibuprofen (ADVIL,MOTRIN) 600 MG tablet Take 600 mg by mouth every 6 (six) hours as needed for moderate pain.    [provider]  metoprolol tartrate (LOPRESSOR) 25 MG tablet Take 25 mg by mouth 2 (two) times daily.    [provider]  metoprolol tartrate (LOPRESSOR) 25 MG tablet Take 25 mg by mouth 2 (two) times daily.    [provider]    oxyCODONE-acetaminophen (PERCOCET/ROXICET) 5-325 MG tablet Take 1 tablet by mouth every 4 (four) hours as needed for severe pain.    [provider]  Prenatal Vit-DSS-Fe Cbn-FA (PRENATAL AD PO) Take 1 tablet by mouth daily.    [provider]  sertraline (ZOLOFT) 25 MG tablet Take 25 mg by mouth daily.    [provider]  sertraline (ZOLOFT) 50 MG tablet Take 50 mg by mouth daily.    [provider]    Family History Family History  Problem Relation Age of Onset  . Diabetes Father   . Skin cancer Maternal Grandmother     Social History Social History   Tobacco Use  . Smoking status: Current Some Day Smoker    Packs/day: 0.50    Types: Cigarettes  . Smokeless tobacco: Never Used  . Tobacco comment: vapor  Substance Use Topics  . Alcohol use: No  . Drug use: No     Allergies   Kiwi extract; Pineapple; Pregabalin; Sulfa antibiotics; and Tramadol   Review of Systems Review of Systems  Cardiovascular: Positive for chest pain.  All other systems reviewed and are negative.    Physical Exam Updated Vital Signs BP 122/77   Pulse (!) 52   Temp 98.8 F (37.1 C) (Oral)   Resp 17   Ht 5\' 4"  (1.626 m)   Wt 85.8 kg   SpO2 97%   BMI 32.46 kg/m   Physical Exam  Constitutional: She is oriented to person, place, and time. She appears well-developed and well-nourished.  HENT:  Head: Normocephalic and atraumatic.  Mouth/Throat:    Cardiovascular: Normal rate and regular rhythm.  No murmur heard. Pulmonary/Chest: Effort normal and breath sounds normal. No respiratory distress.  Abdominal: Soft. There is no tenderness. There is no rebound and no guarding.  Musculoskeletal: She exhibits no edema or tenderness.  Neurological: She is alert and oriented to person, place, and time.  Skin: Skin is warm and dry.  Psychiatric: She has a normal mood and affect. Her behavior is normal.  Nursing note and vitals reviewed.    ED Treatments /  Results  Labs (all labs ordered are listed, but only abnormal results are displayed) Labs Reviewed  CBC - Abnormal; Notable for the following components:      Result Value   WBC 12.7 (*)    All other components within normal limits  D-DIMER, QUANTITATIVE (NOT AT [facility]) - Abnormal; Notable for the following components:   D-Dimer, Quant 0.88 (*)    All other components within normal limits  BASIC METABOLIC PANEL  TROPONIN I  PREGNANCY, URINE    EKG EKG Interpretation  Date/Time:  Thursday  July 04 2018 21:22:56 EDT Ventricular Rate:  64 PR Interval:  122 QRS Duration: 104 QT Interval:  442 QTC Calculation: 455 R Axis:   63 Text Interpretation:  Normal sinus rhythm Incomplete right bundle branch block Borderline ECG Confirmed by Tilden Fossaees, Anahi Belmar [phone]([id number]) on 07/04/2018 9:33:13 PM   Radiology Dg Chest 2 View  Result Date: 07/04/2018 CLINICAL DATA:  Chest pain EXAM: CHEST - 2 VIEW COMPARISON:  12/19/2015 FINDINGS: Prior CABG. Heart and mediastinal contours are within normal limits. No focal opacities or effusions. No acute bony abnormality. IMPRESSION: No active cardiopulmonary disease. Electronically Signed   By: Charlett NoseKevin  Dover M.D.   On: 07/04/2018 23:02    Procedures Procedures (including critical care time) Angiocath insertion Performed by: Tilden FossaElizabeth Marvell Tamer  Consent: Verbal consent obtained. Risks and benefits: risks, benefits and alternatives were discussed Time out: Immediately prior to procedure a "time out" was called to verify the correct patient, procedure, equipment, support staff and site/side marked as required.  Preparation: Patient was prepped and draped in the usual sterile fashion.  Vein Location: left AC  Ultrasound Guided  Gauge: 20  Normal blood return and flush without difficulty Patient tolerance: Patient tolerated the procedure well with no immediate complications.    Medications Ordered in ED Medications - No data to display   Initial Impression  / Assessment and Plan / ED Course  I have reviewed the triage vital signs and the nursing notes.  Pertinent labs & imaging results that were available during my care of the patient were reviewed by me and considered in my medical decision making (see chart for details).     She with history of endocarditis here for evaluation of four days of pleuritic chest pain. No historical features concerning for recurrent endocarditis. She also does have history of DVT. D dimer is mildly elevated, plan to get CTA to further evaluate. Current presentation is not consistent with ACS or dissection. Patient care transferred pending CT abdomen.   She also reports gingival bleeding that began today. She is having a small amount of oozing between her second and third molars. A teabag was placed for hemostasis. Final Clinical Impressions(s) / ED Diagnoses   Final diagnoses:  None    ED Discharge Orders    None       Tilden Fossaees, Chelcea Zahn, MD 07/05/18 [phone]

## 2018-07-05 ENCOUNTER — Emergency Department (HOSPITAL_BASED_OUTPATIENT_CLINIC_OR_DEPARTMENT_OTHER): Payer: Medicare Other

## 2018-07-05 ENCOUNTER — Encounter (HOSPITAL_BASED_OUTPATIENT_CLINIC_OR_DEPARTMENT_OTHER): Payer: Self-pay | Admitting: Emergency Medicine

## 2018-07-05 MED ORDER — IOPAMIDOL (ISOVUE-370) INJECTION 76%
100.0000 mL | Freq: Once | INTRAVENOUS | Status: AC | PRN
  Administered 2018-07-05: 100 mL via INTRAVENOUS

## 2018-07-05 MED ORDER — AMOXICILLIN-POT CLAVULANATE 500-125 MG PO TABS: 1.0000 | ORAL_TABLET | Freq: Three times a day (TID) | ORAL | 0 refills | Status: DC

## 2018-07-05 NOTE — Discharge Instructions (Signed)
Augmentin as prescribed.  Follow-up with your primary doctor in the next 3 to 4 days.

## 2018-07-05 NOTE — ED Notes (Signed)
No bleeding from mouth noted at time of discharge.

## 2018-07-05 NOTE — ED Provider Notes (Signed)
Care assumed from Dr. Madilyn Hookees at shift change.  Patient is awaiting results of a CT scan to rule out PE.  This is been performed and is negative for PE, but does show an inflammatory/infectious type change consistent with infectious bronchitis.  This and the bleeding from her tooth will be treated with Augmentin and direct pressure.  She is to follow-up with primary doctor.   Geoffery Lyonselo, Kawana Hegel, MD 07/05/18 [phone]

## 2018-07-05 NOTE — ED Notes (Signed)
Patient transported to CT 

## 2020-02-09 NOTE — Progress Notes (Signed)
Cardiology Office Note:    Date:  02/12/2020   ID:  Kelly Mora, DOB November 06, 1986, MRN [id number]  PCP:  No primary care provider on file.  Cardiologist:  Chilton Si, MD  Electrophysiologist:  None   Referring MD: Kelly Mora, *   Chief Complaint: follow-up of tricuspid valve repair   History of Present Illness:    Kelly Mora is a 34 y.o. 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 Patient previously followed by Dr. Juliann Mora at [facility]. He was seen by Dr. Duke Mora at the request of Dr. Juliann Mora in 03/2018 to get established with a Cardiologist in [place] before planned C-section at the [facility] just in case Cardiology was needed around time of delivery. She was doing well at that time. Repeat Echo was ordered to ensure valve was stable prior to C-section. Echo at this time showed LVEF of 60-65% and stable tricuspid bioprosthesis. Repeat Echo in 11/2019 at [facility] showed LVEF of 45-50% with mild MR, TR, and PR. Bioprosthetic tricuspid valve was well seated with normal function. Patient was last seen by Dr. Juliann Mora on 11/26/2019 and was doing well from a cardiac standpoint at that time.   Patient presents today for follow-up. Here alone. She wants to switch to our practice and follow with [place] going forward. She reports shorttess of breath with activity which she states has been going on for years but did worsen about 2 months ago. She states she has always had shortness of breath with activity and states she cannot exercise because it feels like her lungs are burning. This is not new but over the last few months she notes getting short of breath when only walking short distances such as walking from the exam table to the door. No chest pain with activity but she does note chest pain if she lays down  flat at night. Again she states this is not new and has been going on for years. She states it is not related to a recent meal. About 1 month ago, she states she was laying down about to fall asleep when she developed significant chest pain and numbness/tingling of her left arm. Pain was so severe she said she thought she was having a heart attack. Pain resolved on its own after about 10 minutes. No other episodes like this. No orthopnea, PND, or edema. She notes feelings of heart racing but this always when she is having a panic attack. No lightheadedness, dizziness, or syncope.    Past Medical History:  Diagnosis Date  . Bipolar disorder (HCC) 03/14/2018  . Endocarditis    drug use  . Essential hypertension 03/14/2018  . 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 Neuropathy   . 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 CARDIAC SURGERY    . CESAREAN SECTION N/A 03/21/2018   Procedure: PRIMARY CESAREAN SECTION;  Surgeon: Kelly Hamman, MD;  Location: [facility] BIRTHING SUITES;  Service: Obstetrics;  Laterality: N/A;  Request RNFA  . TRICUSPID VALVE REPLACEMENT     x 3; last in 2016: Mitral Mosaic 63mm - [id number]    Current Medications: Current Meds  Medication Sig  . Amphetamine ER (ADZENYS XR-ODT) 12.5 MG TBED Adzenys XR-ODT 12.5 mg extended release disintegrating tablet  . cloNIDine (CATAPRES) 0.1 MG tablet Take 0.1  mg by mouth 2 (two) times daily.  Marland Kitchen diltiazem (CARDIZEM) 30 MG tablet Take 30 mg by mouth 2 (two) times daily.  Marland Kitchen lamoTRIgine 200 MG TBDP lamotrigine 200 mg disintegrating tablet  . levonorgestrel (MIRENA, 52 MG,) 20 MCG/24HR IUD Mirena 20 mcg/24 hours (6 yrs) 52 mg intrauterine device  Take 1 device by intrauterine route.  . metoprolol tartrate (LOPRESSOR) 25 MG tablet Take 25 mg by mouth 2 (two) times daily.     Allergies:   Kiwi extract, Pineapple, Pregabalin, Sulfa antibiotics, and Tramadol    Social History   Socioeconomic History  . Marital status: Single    Spouse name: Not on file  . Number of children: Not on file  . Years of education: Not on file  . Highest education level: Not on file  Occupational History  . Not on file  Tobacco Use  . Smoking status: Current Some Day Smoker    Packs/day: 0.50    Types: Cigarettes  . Smokeless tobacco: Never Used  . Tobacco comment: vapor  Substance and Sexual Activity  . Alcohol use: No  . Drug use: Not Currently  . Sexual activity: Yes    Birth control/protection: None, I.U.D.  Other Topics Concern  . Not on file  Social History Narrative   ** Merged History Encounter **       Social Determinants of Health   Financial Resource Strain:   . Difficulty of Paying Living Expenses:   Food Insecurity:   . Worried About Programme researcher, broadcasting/film/video in the Last Year:   . Barista in the Last Year:   Transportation Needs:   . Freight forwarder (Medical):   Marland Kitchen Lack of Transportation (Non-Medical):   Physical Activity:   . Days of Exercise per Week:   . Minutes of Exercise per Session:   Stress:   . Feeling of Stress :   Social Connections:   . Frequency of Communication with Friends and Family:   . Frequency of Social Gatherings with Friends and Family:   . Attends Religious Services:   . Active Member of Clubs or Organizations:   . Attends Banker Meetings:   Marland Kitchen Marital Status:      Family History: The patient's family history includes Diabetes in her father; Skin cancer in her maternal grandmother.  ROS:   Please see the history of present illness.     EKGs/Labs/Other Studies Reviewed:    The following studies were reviewed today:  Echocardiogram 03/20/2018: Study Conclusions: - Left ventricle: The cavity size was normal. Wall thickness was  normal. Systolic function was normal. The estimated ejection  fraction was in the range of 60% to 65%.  - Mitral valve: There was mild  regurgitation.  - Tricuspid valve: A bioprosthesis was present. _______________  Echocardiogram 11/19/2019 (Kelly): Impressions: - LVEF 45-50%. - Normal RV systolic function. - Mild MR, TR, PR. - Bioprosthetic tricuspid valve well seated. Normal function.    EKG:  EKG ordered today. EKG personally reviewed and demonstrates normal sinus rhythm, rate 61 bpm, with incomplete RBBB, T wave inversion in lead V3 (seen on prior tracings) and biphasic T wave in V4-V6 which are new.    Recent Labs: No results found for requested labs within last 8760 hours.  Recent Lipid Panel No results found for: CHOL, TRIG, HDL, CHOLHDL, VLDL, LDLCALC, LDLDIRECT  Physical Exam:    Vital Signs: BP 108/78   Pulse 61   Ht 5' 4.5" (1.638 m)  Wt 175 lb 3.2 oz (79.5 kg)   SpO2 96%   BMI 29.61 kg/m     Wt Readings from Last 3 Encounters:  02/12/20 175 lb 3.2 oz (79.5 kg)  07/04/18 189 lb 2 oz (85.8 kg)  03/19/18 186 lb (84.4 kg)     General: 34 y.o. female in no acute distress. HEENT: Normocephalic and atraumatic. Sclera clear. EOMs intact. Neck: Supple. No carotid bruits. No JVD. Heart: RRR. Distinct S1 and S2. No murmurs, gallops, or rubs. Radial and distal pedal pulses 2+ and equal bilaterally. Lungs: No increased work of breathing. Clear to ausculation bilaterally. No wheezes, rhonchi, or rales.  Abdomen: Soft, non-distended, and non-tender to palpation. Bowel sounds present in all 4 quadrants.  Extremities: No lower extremity edema.    Skin: Warm and dry. Neuro: Alert and oriented x3. No focal deficits. Psych: Normal affect. Responds appropriately.   Assessment:    1. Dyspnea on exertion   2. Atypical chest pain   3. S/P tricuspid valve replacement   4. Essential hypertension     Plan:    Dyspnea on Exertion and Atypical Chest pain - Patient reports worsened dyspnea on exertion as well as one episode of atypical chest pain with associated left arm numbness at rest.  - EKG today shows  normal sinus rhythm, rate 61 bpm, with incomplete RBBB and biphasic T wave in V4-V6 which are new.   - Does not appear volume overloaded on exam so do not think CHF.  - Patient does have history of PE but low suspicion for this as cause of dyspnea given no hypoxia, tachycardia, or tachypnea and duration of symptoms.  - Doubt significant CAD given age but patient does have a history of hypertension and 14 year smoking history. Will go ahead and get [employer]. Patient agreeable to this.   S/p Tricuspid Valve Replacement x3 - Patient has had 3 tricuspid valve replacement (03/2013, 08/2013, and 11/2014) due to endocarditis from drug use. She denies any recurrent drug use.  - Most recent Echo in 11/2019 at [facility] showed LVEF of 45-50% with mild MR, TR, and PR. EF down from 60-65% in 03/2018.  - No murmur appreciated on exam. No signs of CHF on exam. - Continue SBE prophylaxis.   Hypertension  - BP well controlled at 108/78.  - Continue current medications: Clonidine 0.1mg  twice daily, Cardizem 30mg  twice daily, and Lopressor 25mg  twice daily.   Tobacco Use - Patient reports smoking since the age of 78. She used to smoke 1 pack per day before child was born almost 2 years ago but now only smoke 2-3 cigarettes per day. She also reports vaping. This may be contributing to patient's shortness of breath.  - Discussed the importance of complete cessation.   Of note, patient recently had labs checked at new PCP's office [facility]) last week. Will try to get a copy of these labs.   Disposition: Follow up in 4-6 months with Dr. Oval Linsey.   Medication Adjustments/Labs and Tests Ordered: Current medicines are reviewed at length with the patient today.  Concerns regarding medicines are outlined above.  Orders Placed This Encounter  Procedures  . MYOCARDIAL PERFUSION IMAGING  . EKG 12-Lead   No orders of the defined types were placed in this encounter.   Patient Instructions  Medication Instructions:    Your physician recommends that you continue on your current medications as directed. Please refer to the Current Medication list given to you today.  *If you need a  refill on your cardiac medications before your next appointment, please call your pharmacy*   Lab Work: NONE If you have labs (blood work) drawn today and your tests are completely normal, you will receive your results only by: Marland Kitchen MyChart Message (if you have MyChart) OR . A paper copy in the mail If you have any lab test that is abnormal or we need to change your treatment, we will call you to review the results.   Testing/Procedures: Your physician has requested that you have a lexiscan myoview. For further information please visit [email or web address] Please follow instruction sheet, as given.  Follow-Up: At [facility], you and your health needs are our priority.  As part of our continuing mission to provide you with exceptional heart care, we have created designated Provider Care Teams.  These Care Teams include your primary Cardiologist (physician) and Advanced Practice Providers (APPs -  Physician Assistants and Nurse Practitioners) who all work together to provide you with the care you need, when you need it.  We recommend signing up for the patient portal called "MyChart".  Sign up information is provided on this After Visit Summary.  MyChart is used to connect with patients for Virtual Visits (Telemedicine).  Patients are able to view lab/test results, encounter notes, upcoming appointments, etc.  Non-urgent messages can be sent to your provider as well.   To learn more about what you can do with MyChart, go to [email or web address].    Your next appointment:   4-6 month(s)  The format for your next appointment:   In Person  Provider:   You may see Chilton Si, MD or one of the following Advanced Practice Providers on your designated Care Team:    Corine Shelter, PA-C  [place], [place]  Edd Fabian, FNP    Other Instructions      Signed, Smitty Knudsen  02/12/2020 1:11 PM    [facility]

## 2020-02-10 ENCOUNTER — Telehealth: Payer: Self-pay

## 2020-02-10 NOTE — Telephone Encounter (Signed)
Sounds good. Thank you for checking on that!

## 2020-02-10 NOTE — Telephone Encounter (Signed)
Contacted patient, she states that she is moving from her old clinic here, and would like to be followed here.   I will notify Callie, PA so she is aware.   Thanks!

## 2020-02-12 ENCOUNTER — Ambulatory Visit (INDEPENDENT_AMBULATORY_CARE_PROVIDER_SITE_OTHER): Payer: Medicare Other | Admitting: Student

## 2020-02-12 ENCOUNTER — Encounter: Payer: Self-pay | Admitting: Student

## 2020-02-12 ENCOUNTER — Other Ambulatory Visit: Payer: Self-pay

## 2020-02-12 VITALS — BP 108/78 | HR 61 | Ht 64.5 in | Wt 175.2 lb

## 2020-02-12 DIAGNOSIS — R0600 Dyspnea, unspecified: Secondary | ICD-10-CM

## 2020-02-12 DIAGNOSIS — I10 Essential (primary) hypertension: Secondary | ICD-10-CM

## 2020-02-12 DIAGNOSIS — Z954 Presence of other heart-valve replacement: Secondary | ICD-10-CM

## 2020-02-12 DIAGNOSIS — R0789 Other chest pain: Secondary | ICD-10-CM

## 2020-02-12 DIAGNOSIS — R0609 Other forms of dyspnea: Secondary | ICD-10-CM

## 2020-02-12 NOTE — Patient Instructions (Signed)
Medication Instructions:  Your physician recommends that you continue on your current medications as directed. Please refer to the Current Medication list given to you today.  *If you need a refill on your cardiac medications before your next appointment, please call your pharmacy*   Lab Work: NONE If you have labs (blood work) drawn today and your tests are completely normal, you will receive your results only by: Marland Kitchen MyChart Message (if you have MyChart) OR . A paper copy in the mail If you have any lab test that is abnormal or we need to change your treatment, we will call you to review the results.   Testing/Procedures: Your physician has requested that you have a lexiscan myoview. For further information please visit [email or web address] Please follow instruction sheet, as given.  Follow-Up: At [facility], you and your health needs are our priority.  As part of our continuing mission to provide you with exceptional heart care, we have created designated Provider Care Teams.  These Care Teams include your primary Cardiologist (physician) and Advanced Practice Providers (APPs -  Physician Assistants and Nurse Practitioners) who all work together to provide you with the care you need, when you need it.  We recommend signing up for the patient portal called "MyChart".  Sign up information is provided on this After Visit Summary.  MyChart is used to connect with patients for Virtual Visits (Telemedicine).  Patients are able to view lab/test results, encounter notes, upcoming appointments, etc.  Non-urgent messages can be sent to your provider as well.   To learn more about what you can do with MyChart, go to [email or web address].    Your next appointment:   4-6 month(s)  The format for your next appointment:   In Person  Provider:   You may see Chilton Si, MD or one of the following Advanced Practice Providers on your designated Care Team:    Corine Shelter, PA-C  [place], [place]  Edd Fabian, [place]    Other Instructions

## 2020-02-24 ENCOUNTER — Telehealth (HOSPITAL_COMMUNITY): Payer: Self-pay

## 2020-02-24 NOTE — Telephone Encounter (Signed)
Encounter complete. 

## 2020-02-26 ENCOUNTER — Ambulatory Visit (HOSPITAL_COMMUNITY)
Admission: RE | Admit: 2020-02-26 | Discharge: 2020-02-26 | Disposition: A | Discharge status: Home / Self Care | Payer: Medicare Other | Source: Ambulatory Visit | Attending: Cardiology | Admitting: Cardiology

## 2020-02-26 ENCOUNTER — Other Ambulatory Visit: Payer: Self-pay

## 2020-02-26 DIAGNOSIS — R0789 Other chest pain: Secondary | ICD-10-CM | POA: Diagnosis present

## 2020-02-26 LAB — MYOCARDIAL PERFUSION IMAGING
LV dias vol: 55 mL (ref 46–106)
LV sys vol: 126 mL
Peak HR: 101 {beats}/min
Rest HR: 59 {beats}/min
SDS: 3
SRS: 5
SSS: 8
TID: 0.95

## 2020-02-26 MED ORDER — AMINOPHYLLINE 25 MG/ML IV SOLN
75.0000 mg | Freq: Once | INTRAVENOUS | Status: AC
  Administered 2020-02-26: 75 mg via INTRAVENOUS

## 2020-02-26 MED ORDER — TECHNETIUM TC 99M TETROFOSMIN IV KIT
32.2000 | PACK | Freq: Once | INTRAVENOUS | Status: AC | PRN
  Administered 2020-02-26: 32.2 via INTRAVENOUS
  Filled 2020-02-26: qty 33

## 2020-02-26 MED ORDER — REGADENOSON 0.4 MG/5ML IV SOLN
0.4000 mg | Freq: Once | INTRAVENOUS | Status: AC
  Administered 2020-02-26: 0.4 mg via INTRAVENOUS

## 2020-02-26 MED ORDER — TECHNETIUM TC 99M TETROFOSMIN IV KIT
10.7000 | PACK | Freq: Once | INTRAVENOUS | Status: AC | PRN
  Administered 2020-02-26: 10.7 via INTRAVENOUS
  Filled 2020-02-26: qty 11

## 2020-06-01 ENCOUNTER — Telehealth: Payer: Self-pay | Admitting: *Deleted

## 2020-06-01 NOTE — Telephone Encounter (Signed)
A message was left, re: her follow up visit. 

## 2020-06-14 ENCOUNTER — Encounter: Payer: Self-pay | Admitting: Cardiovascular Disease

## 2020-06-14 ENCOUNTER — Other Ambulatory Visit: Payer: Self-pay

## 2020-06-14 ENCOUNTER — Ambulatory Visit (INDEPENDENT_AMBULATORY_CARE_PROVIDER_SITE_OTHER): Payer: Medicare Other | Admitting: Cardiovascular Disease

## 2020-06-14 VITALS — BP 104/76 | HR 71 | Ht 65.0 in | Wt 160.0 lb

## 2020-06-14 DIAGNOSIS — Z954 Presence of other heart-valve replacement: Secondary | ICD-10-CM | POA: Diagnosis not present

## 2020-06-14 NOTE — Patient Instructions (Signed)
Medication Instructions:  Your physician recommends that you continue on your current medications as directed. Please refer to the Current Medication list given to you today.  *If you need a refill on your cardiac medications before your next appointment, please call your pharmacy*  Lab Work: NONE If you have labs (blood work) drawn today and your tests are completely normal, you will receive your results only by: . MyChart Message (if you have MyChart) OR . A paper copy in the mail If you have any lab test that is abnormal or we need to change your treatment, we will call you to review the results.  Testing/Procedures: NONE  Follow-Up: At [facility], you and your health needs are our priority.  As part of our continuing mission to provide you with exceptional heart care, we have created designated Provider Care Teams.  These Care Teams include your primary Cardiologist (physician) and Advanced Practice Providers (APPs -  Physician Assistants and Nurse Practitioners) who all work together to provide you with the care you need, when you need it.  We recommend signing up for the patient portal called "MyChart".  Sign up information is provided on this After Visit Summary.  MyChart is used to connect with patients for Virtual Visits (Telemedicine).  Patients are able to view lab/test results, encounter notes, upcoming appointments, etc.  Non-urgent messages can be sent to your provider as well.   To learn more about what you can do with MyChart, go to [email or web address].com.    Your next appointment:   12 month(s) You will receive a reminder letter in the mail two months in advance. If you don't receive a letter, please call our office to schedule the follow-up appointment.   The format for your next appointment:   In Person  Provider:   You may see Tiffany Meadowdale, MD or one of the following Advanced Practice Providers on your designated Care Team:    Luke Kilroy, PA-C  Callie  Goodrich, PA-C  Jesse Cleaver, FNP      

## 2020-06-14 NOTE — Progress Notes (Signed)
Cardiology Office Note   Date:  06/14/2020   ID:  Kelly Mora, DOB June 26, 1986, MRN [id number]  PCP:  Patient, No Pcp Per  Cardiologist:   Dr. Dorothyann Peng OB/GYN: Dr. Mindi Slicker   No chief complaint on file.     History of Present Illness: Kelly Mora is a 34 y.o. pregnant female with endocarditis status post tricuspid valve replacement x3, hypertension, pulmonary embolism, substance abuse history (none x2 years) and bipolar disorder, who is being seen today for the evaluation of presurgical risk assessment prior to C-section at the request of No ref. provider found.  [name] is a patient of Dr. Juliann Pares and last saw him 02/2018.  At that time she was doing well.  She had an echo 09/2017 that revealed LVEF 50% with mild MR, mild TR, and mild PR.  She has a history of substance abuse and endocarditis and underwent tricuspid valve replacement twice in 2014 and once in 2016.  She developed a pulmonary embolism after her second surgery.  Ms. Kelly Mora quit smoking at the time she became pregnant.  She continues to abstain from illicits.  [name] has been feeling well.  Since her last appointment she had an appointment with Rosalyn Gess, PA-C.  At that time she complained of some  chest discomfort.  She was referred for [facility] 02/2020 that revealed LVEF 56% and no ischemia.  She had an echo 03/2018 that revealed that her tricuspid valve was stable.  She does not check her blood pressure at home often, but when she does it is well-controlled.  She has no lightheadedness or dizziness, though her blood pressure is often in the 100s systolic.  She denies any chest pain.  She does have some chronic shortness of breath that is stable and unchanged.  She denies lower extremity edema, orthopnea, or PND.  Overall she is doing well.  She does get much formal exercise but chases after her 8-year-old son without problems.  Her diet is pretty healthy.   Past Medical History:  Diagnosis Date    . Bipolar disorder (HCC) 03/14/2018  . Endocarditis    drug use  . Essential hypertension 03/14/2018  . 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 Neuropathy   . 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 CARDIAC SURGERY    . CESAREAN SECTION N/A 03/21/2018   Procedure: PRIMARY CESAREAN SECTION;  Surgeon: Lavina Hamman, MD;  Location: [facility] BIRTHING SUITES;  Service: Obstetrics;  Laterality: N/A;  Request RNFA  . TRICUSPID VALVE REPLACEMENT     x 3; last in 2016: Mitral Mosaic 73mm - [id number]     Current Outpatient Medications  Medication Sig Dispense Refill  . Amphetamine ER (ADZENYS XR-ODT) 12.5 MG TBED Adzenys XR-ODT 12.5 mg extended release disintegrating tablet    . cloNIDine (CATAPRES) 0.1 MG tablet Take 0.1 mg by mouth 2 (two) times daily.    Marland Kitchen diltiazem (CARDIZEM) 30 MG tablet Take 30 mg by mouth 2 (two) times daily.    Marland Kitchen FLUoxetine (PROZAC) 40 MG capsule fluoxetine 40 mg capsule    . FLUoxetine HCl 60 MG TABS fluoxetine 60 mg tablet    . lamoTRIgine 200 MG TBDP lamotrigine 200 mg disintegrating tablet    . levonorgestrel (MIRENA, 52 MG,) 20 MCG/24HR IUD Mirena 20 mcg/24 hours (6 yrs) 52 mg intrauterine device  Take 1 device by intrauterine route.    . metoprolol tartrate (LOPRESSOR)  25 MG tablet Take 25 mg by mouth 2 (two) times daily.     No current facility-administered medications for this visit.    Allergies:   Kiwi extract, Pineapple, Pregabalin, Sulfa antibiotics, and Tramadol    Social History:  The patient  reports that she has been smoking cigarettes. She has been smoking about 0.50 packs per day. She has never used smokeless tobacco. She reports previous drug use. She reports that she does not drink alcohol.   Family History:  The patient's family history includes Diabetes in her father; Skin cancer in her maternal grandmother.    ROS:  Please see the history of present illness.    Otherwise, review of systems are positive for none.   All other systems are reviewed and negative.    PHYSICAL EXAM: VS:  BP 104/76   Pulse 71   Ht 5\' 5"  (1.651 m)   Wt 160 lb (72.6 kg)   BMI 26.63 kg/m  , BMI Body mass index is 26.63 kg/m. GENERAL:  Well appearing.  No acute distress.  HEENT:  Pupils equal round and reactive, fundi not visualized, oral mucosa unremarkable NECK:  No jugular venous distention, waveform within normal limits, carotid upstroke brisk and symmetric, no bruits LUNGS:  Clear to auscultation bilaterally HEART:  RRR.  PMI not displaced or sustained,S1 and S2 within normal limits, no S3, no S4, no clicks, no rubs, no murmurs ABD:  Gravid uterus. Positive bowel sounds normal in frequency in pitch, no bruits, no rebound, no guarding, no midline pulsatile mass, no hepatomegaly, no splenomegaly EXT:  2 plus pulses throughout, no edema, no cyanosis no clubbing SKIN:  No rashes no nodules NEURO:  Cranial nerves II through XII grossly intact, motor grossly intact throughout PSYCH:  Cognitively intact, oriented to person place and time   EKG:  EKG is not ordered today. The ekg ordered today demonstrates sinus rhythm.  Rate 61 bpm.  Incomplete RBBB.   Lexiscan Myoview 02/26/20:  The left ventricular ejection fraction is normal (55-65%).  Nuclear stress EF: 56%.  There was no ST segment deviation noted during stress.  The study is normal.  This is a low risk study.   Normal stress nuclear study with no ischemia or infarction; gated EF 56 with normal wall motion.   Echo 03/20/18: Study Conclusions   - Left ventricle: The cavity size was normal. Wall thickness was  normal. Systolic function was normal. The estimated ejection  fraction was in the range of 60% to 65%.  - Mitral valve: There was mild regurgitation.  - Tricuspid valve: A bioprosthesis was present.   Recent Labs: No results found for requested labs within last 8760 hours.    Lipid  Panel No results found for: CHOL, TRIG, HDL, CHOLHDL, VLDL, LDLCALC, LDLDIRECT    Wt Readings from Last 3 Encounters:  06/14/20 160 lb (72.6 kg)  02/26/20 175 lb (79.4 kg)  02/12/20 175 lb 3.2 oz (79.5 kg)      ASSESSMENT AND PLAN:  # S/p tricuspid valve replacement:  Ms. Vencill had her tricuspid valve replaced 3 times due to bacterial endocarditis in the setting of drug abuse.  She continues to maintain her sobriety.  She is doing well.  She has no heart failure symptoms.  She has some chronic shortness of breath that is unchanged.  She is euvolemic.  Echo was stable in 2019.  Antibiotics are needed prior to dental procedures.  # Hypertension: BP stable on clonidine, metoprolol, and dilitazem.  Her  blood pressure is little low but she has no lightheadedness or dizziness.  She does not plan to have any pregnancies at this time.  She understands that if she changes her mind she will let us know so we can change her regimen.  # CV Disease Prevention:  Check lipids at follow-up.  She will work on increasing her exercise.   Current medicines are reviewed at length with the patient today.  The patient does not have concerns regarding medicines.  The following changes have been made:  no change  Labs/ tests ordered today include:   No orders of the defined types were placed in this encounter.    Disposition:   FU with Alandis Bluemel C. Duke Salvia, MD, [facility] in 1 year   Signed, Rajeev Escue C. Duke Salvia, MD, [facility]  06/14/2020 8:46 AM    [facility]

## 2021-03-10 ENCOUNTER — Telehealth: Payer: Self-pay | Admitting: Cardiovascular Disease

## 2021-03-10 NOTE — Telephone Encounter (Signed)
New Message:      Pt says she have a rash on her neck where she had the porter for her open heart surgery. This rash have been going for 4 weeks and seems to be getting worse. She looks almos like a broken blood vessel.

## 2021-03-10 NOTE — Telephone Encounter (Signed)
Patient informed and verbalized understanding of plan. 

## 2021-03-10 NOTE — Telephone Encounter (Signed)
Reports flat, red, spotty rash on front of neck, under the skin, in area of open heart surgery area. Reports rash has been there for the past 4-6 weeks. Denies itching, drainage or fever. Appointment given to patient already by scheduler 03/30/21. Advised to contact her PCP to be seen sooner. Verbalized understanding of plan.

## 2021-03-10 NOTE — Telephone Encounter (Signed)
Does not need 03/30/21 appt.

## 2021-03-30 ENCOUNTER — Ambulatory Visit: Payer: Medicare Other | Admitting: Student

## 2021-05-06 ENCOUNTER — Emergency Department (HOSPITAL_BASED_OUTPATIENT_CLINIC_OR_DEPARTMENT_OTHER)
Admission: EM | Admit: 2021-05-06 | Discharge: 2021-05-07 | Disposition: A | Discharge status: Home / Self Care | Payer: Medicare Other | Attending: Emergency Medicine | Admitting: Emergency Medicine

## 2021-05-06 ENCOUNTER — Other Ambulatory Visit: Payer: Self-pay

## 2021-05-06 ENCOUNTER — Emergency Department (HOSPITAL_BASED_OUTPATIENT_CLINIC_OR_DEPARTMENT_OTHER): Payer: Medicare Other

## 2021-05-06 DIAGNOSIS — R0789 Other chest pain: Secondary | ICD-10-CM | POA: Diagnosis present

## 2021-05-06 DIAGNOSIS — Z79899 Other long term (current) drug therapy: Secondary | ICD-10-CM | POA: Insufficient documentation

## 2021-05-06 DIAGNOSIS — F17210 Nicotine dependence, cigarettes, uncomplicated: Secondary | ICD-10-CM | POA: Insufficient documentation

## 2021-05-06 DIAGNOSIS — U071 COVID-19: Secondary | ICD-10-CM | POA: Insufficient documentation

## 2021-05-06 DIAGNOSIS — I10 Essential (primary) hypertension: Secondary | ICD-10-CM | POA: Diagnosis not present

## 2021-05-06 DIAGNOSIS — O200 Threatened abortion: Secondary | ICD-10-CM | POA: Diagnosis not present

## 2021-05-06 DIAGNOSIS — R079 Chest pain, unspecified: Secondary | ICD-10-CM

## 2021-05-06 LAB — BASIC METABOLIC PANEL
Anion gap: 10 (ref 5–15)
BUN: 16 mg/dL (ref 6–20)
CO2: 25 mmol/L (ref 22–32)
Calcium: 9.9 mg/dL (ref 8.9–10.3)
Chloride: 102 mmol/L (ref 98–111)
Creatinine, Ser: 0.89 mg/dL (ref 0.44–1.00)
GFR, Estimated: >60 mL/min (ref >60)
Glucose, Bld: 71 mg/dL (ref 70–99)
Potassium: 3.8 mmol/L (ref 3.5–5.1)
Sodium: 137 mmol/L (ref 135–145)

## 2021-05-06 LAB — CBC
HCT: 45.7 % (ref 36.0–46.0)
Hemoglobin: 15.8 g/dL — ABNORMAL HIGH (ref 12.0–15.0)
MCH: 30.6 pg (ref 26.0–34.0)
MCHC: 34.6 g/dL (ref 30.0–36.0)
MCV: 88.4 fL (ref 80.0–100.0)
Platelets: 127 10*3/uL — ABNORMAL LOW (ref 150–400)
RBC: 5.17 MIL/uL — ABNORMAL HIGH (ref 3.87–5.11)
RDW: 12.5 % (ref 11.5–15.5)
WBC: 4.9 10*3/uL (ref 4.0–10.5)
nRBC: 0 % (ref 0.0–0.2)

## 2021-05-06 LAB — TROPONIN I (HIGH SENSITIVITY): Troponin I (High Sensitivity): 3 ng/L (ref <18)

## 2021-05-06 NOTE — ED Triage Notes (Signed)
Pt to ED from home c/o mid-sternal intermittent chest pain that radiates between the shoulder blades that began at 1830 this evening. Pt states that pain is better at rest. Pt has cx hx with three open heart surgeries with a tricuspid valve replacement the last being in 2016.

## 2021-05-06 NOTE — ED Provider Notes (Signed)
[facility] EMERGENCY DEPT Provider Note   CSN: [id number] Arrival date & time: 05/06/21  2126     History Chief Complaint  Patient presents with   Chest Pain    Kelly Mora is a 35 y.o. female.  Patient presents with mid to left-sided chest pain that started around 6 PM today.  Patient says she was at rest when this started.  Describes a sharp and aching in the mid chest region.  It radiates to her back.  Denies fevers or cough no vomiting or diarrhea.  Patient states she has a history of endocarditis and cardiac surgery for tricuspid valve replacement with a porcine valve.      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   Essential hypertension 03/14/2018   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) 03/14/2018    Past Surgical History:  Procedure Laterality Date   CARDIAC SURGERY     CESAREAN SECTION N/A 03/21/2018   Procedure: PRIMARY CESAREAN SECTION;  Surgeon: Lavina Hamman, MD;  Location: [facility] BIRTHING SUITES;  Service: Obstetrics;  Laterality: N/A;  Request RNFA   TRICUSPID VALVE REPLACEMENT     x 3; last in 2016: Mitral Mosaic 6mm - [id number]     OB History     Gravida  1   Para  1   Term  0   Preterm  1   AB  0   Living  1      SAB  0   IAB  0   Ectopic  0   Multiple      Live Births  1           Family History  Problem Relation Age of Onset   Diabetes Father    Skin cancer Maternal Grandmother     Social History    Tobacco Use   Smoking status: Some Days    Packs/day: 0.50    Pack years: 0.00    Types: Cigarettes   Smokeless tobacco: Never   Tobacco comments:    vapor  Substance Use Topics   Alcohol use: No   Drug use: Not Currently    Home Medications Prior to Admission medications   Medication Sig Start Date End Date Taking? Authorizing Provider  Amphetamine ER (ADZENYS XR-ODT) 12.5 MG TBED Adzenys XR-ODT 12.5 mg extended release disintegrating tablet    [provider]  cloNIDine (CATAPRES) 0.1 MG tablet Take 0.1 mg by mouth 2 (two) times daily.    [provider]  diltiazem (CARDIZEM) 30 MG tablet Take 30 mg by mouth 2 (two) times daily.    [provider]  FLUoxetine (PROZAC) 40 MG capsule fluoxetine 40 mg capsule 07/02/18   [provider]  FLUoxetine HCl 60 MG TABS fluoxetine 60 mg tablet    [provider]  lamoTRIgine 200 MG TBDP lamotrigine 200 mg disintegrating tablet    [provider]  levonorgestrel (MIRENA, 52 MG,) 20 MCG/24HR IUD Mirena 20 mcg/24 hours (6 yrs) 52 mg  intrauterine device  Take 1 device by intrauterine route.    [provider]  metoprolol tartrate (LOPRESSOR) 25 MG tablet Take 25 mg by mouth 2 (two) times daily.    [provider]    Allergies    Kiwi extract, Pineapple, Pregabalin, Sulfa antibiotics, and Tramadol  Review of Systems   Review of Systems  Constitutional:  Negative for fever.  HENT:  Negative for ear pain.   Eyes:  Negative for pain.  Respiratory:  Negative for cough.   Cardiovascular:  Positive for chest pain.  Gastrointestinal:  Negative for abdominal pain.  Genitourinary:  Negative for flank pain.  Musculoskeletal:  Negative for back pain.  Skin:  Negative for rash.  Neurological:  Negative for headaches.   Physical Exam Updated Vital Signs BP 110/73 (BP Location: Right Arm)   Pulse 73   Temp 98.2 F (36.8 C) (Oral)   Resp 18   Ht 5\' 5"  (1.651 m)   Wt 74.8  kg   SpO2 97%   BMI 27.46 kg/m   Physical Exam Constitutional:      General: She is not in acute distress.    Appearance: Normal appearance.  HENT:     Head: Normocephalic.     Nose: Nose normal.  Eyes:     Extraocular Movements: Extraocular movements intact.  Cardiovascular:     Rate and Rhythm: Normal rate.  Pulmonary:     Effort: Pulmonary effort is normal.  Musculoskeletal:        General: Normal range of motion.     Cervical back: Normal range of motion.  Neurological:     General: No focal deficit present.     Mental Status: She is alert. Mental status is at baseline.    ED Results / Procedures / Treatments   Labs (all labs ordered are listed, but only abnormal results are displayed) Labs Reviewed  CBC - Abnormal; Notable for the following components:      Result Value   RBC 5.17 (*)    Hemoglobin 15.8 (*)    Platelets 127 (*)    All other components within normal limits  BASIC METABOLIC PANEL  PREGNANCY, URINE  TROPONIN I (HIGH SENSITIVITY)  TROPONIN I (HIGH SENSITIVITY)    EKG EKG Interpretation  Date/Time:  Friday May 06 2021 21:42:45 EDT Ventricular Rate:  90 PR Interval:  102 QRS Duration: 94 QT Interval:  370 QTC Calculation: 452 R Axis:   135 Text Interpretation: Sinus rhythm with short PR Right axis deviation Incomplete right bundle branch block Possible Right ventricular hypertrophy Nonspecific T wave abnormality Abnormal ECG Confirmed by [id number] ([id number]) on 05/06/2021 9:56:33 PM  Radiology No results found.  Procedures Procedures   Medications Ordered in ED Medications - No data to display  ED Course  I have reviewed the triage vital signs and the nursing notes.  Pertinent labs & imaging results that were available during my care of the patient were reviewed by me and considered in my medical decision making (see chart for details).    MDM Rules/Calculators/A&P                          Labs show normal hemoglobin normal white  count.  Chemistry remarkable initial troponin is normal.  Repeat troponin ordered and pending.   Final Clinical Impression(s) / ED Diagnoses Final diagnoses:  Chest pain, unspecified type    Rx / DC Orders ED Discharge Orders  None        Cheryll Cockayne, MD 05/06/21 2328

## 2021-05-07 LAB — HEPATIC FUNCTION PANEL
ALT: 23 U/L (ref 0–44)
AST: 23 U/L (ref 15–41)
Albumin: 4.3 g/dL (ref 3.5–5.0)
Alkaline Phosphatase: 47 U/L (ref 38–126)
Bilirubin, Direct: 0.1 mg/dL (ref 0.0–0.2)
Indirect Bilirubin: 0.4 mg/dL (ref 0.3–0.9)
Total Bilirubin: 0.5 mg/dL (ref 0.3–1.2)
Total Protein: 6.7 g/dL (ref 6.5–8.1)

## 2021-05-07 LAB — RESP PANEL BY RT-PCR (FLU A&B, COVID) ARPGX2
Influenza A by PCR: NEGATIVE
Influenza B by PCR: NEGATIVE
SARS Coronavirus 2 by RT PCR: POSITIVE — AB

## 2021-05-07 LAB — C-REACTIVE PROTEIN: CRP: 0.7 mg/dL (ref <1.0)

## 2021-05-07 LAB — SEDIMENTATION RATE: Sed Rate: 5 mm/hr (ref 0–22)

## 2021-05-07 LAB — LACTIC ACID, PLASMA: Lactic Acid, Venous: 0.5 mmol/L (ref 0.5–1.9)

## 2021-05-07 LAB — HCG, QUANTITATIVE, PREGNANCY: hCG, Beta Chain, Quant, S: <1 m[IU]/mL (ref <5)

## 2021-05-07 LAB — TROPONIN I (HIGH SENSITIVITY): Troponin I (High Sensitivity): 2 ng/L (ref <18)

## 2021-05-07 MED ORDER — PAXLOVID 10 X 150 MG & 10 X 100MG PO TBPK: 2.0000 | ORAL_TABLET | Freq: Two times a day (BID) | ORAL | 0 refills | Status: AC

## 2021-05-07 NOTE — Discharge Instructions (Addendum)

## 2021-05-07 NOTE — ED Provider Notes (Signed)
I assumed care in signout to follow-up with patient's labs. In brief, patient has a history of tricuspid valve replacement due to endocarditis.  Patient has previous history of IV drug abuse/opioid use disorder Patient denies any recent IV drug abuse  Patient tells me that she began having chest pain earlier in the day that radiated to her back.  It was not pleuritic.  It felt exertional She also reports over the past 24 hours she has had fever and chills and diarrhea.  No cough but does report congestion Patient reports she has had multiple dental procedures over the past several months.  Last dental cleaning was earlier in the week.  She reports she always takes amoxicillin prophylaxis  On my exam patient is well-appearing.  No loud/harsh murmurs.  No Osler nodes or Janeway lesions or splinter hemorrhages noted  Recurrent endocarditis is a possibility given recent dental procedure Will also screen for COVID-19  I have reviewed EKG, no acute change to prior. Per cardiology notes, patient has history of nonischemic cardiomyopathy.   Zadie Rhine, MD 05/07/21 [phone]

## 2021-05-07 NOTE — ED Provider Notes (Signed)
Patient found to be positive for COVID-19.  She is in no acute distress and appropriate for outpatient management.  This would explain her recent fever, chills and diarrhea.  Lower suspicion for endocarditis at this time.  Due to underlying health conditions, I will prescribe her Paxlovid Patient reports she is  not vaccinated for COVID-19  Also advised her that anytime she has dental procedure she should  take 2 g amoxicillin   Zadie Rhine, MD 05/07/21 [phone]

## 2021-05-07 NOTE — ED Notes (Signed)
Bebe Shaggy, MD made aware of patients Positive COVID-19 Results. No New Orders at this Time.

## 2021-05-12 LAB — CULTURE, BLOOD (ROUTINE X 2)
Culture: NO GROWTH
Culture: NO GROWTH
Special Requests: ADEQUATE
Special Requests: ADEQUATE

## 2021-07-28 ENCOUNTER — Ambulatory Visit (INDEPENDENT_AMBULATORY_CARE_PROVIDER_SITE_OTHER): Payer: Medicare Other | Admitting: Cardiovascular Disease

## 2021-07-28 ENCOUNTER — Other Ambulatory Visit: Payer: Self-pay

## 2021-07-28 ENCOUNTER — Encounter (HOSPITAL_BASED_OUTPATIENT_CLINIC_OR_DEPARTMENT_OTHER): Payer: Self-pay | Admitting: Cardiovascular Disease

## 2021-07-28 DIAGNOSIS — F17290 Nicotine dependence, other tobacco product, uncomplicated: Secondary | ICD-10-CM

## 2021-07-28 DIAGNOSIS — I10 Essential (primary) hypertension: Secondary | ICD-10-CM

## 2021-07-28 DIAGNOSIS — F319 Bipolar disorder, unspecified: Secondary | ICD-10-CM

## 2021-07-28 DIAGNOSIS — Z954 Presence of other heart-valve replacement: Secondary | ICD-10-CM | POA: Diagnosis not present

## 2021-07-28 DIAGNOSIS — R0602 Shortness of breath: Secondary | ICD-10-CM

## 2021-07-28 HISTORY — DX: Shortness of breath: R06.02

## 2021-07-28 HISTORY — DX: Nicotine dependence, other tobacco product, uncomplicated: F17.290

## 2021-07-28 NOTE — Assessment & Plan Note (Signed)
Well-controlled off medications.  She continues to see her therapist.

## 2021-07-28 NOTE — Assessment & Plan Note (Signed)
She is not ready to quit.  Likely contributing to her shortness of breath.

## 2021-07-28 NOTE — Assessment & Plan Note (Signed)
Likely related to smoking and vaping.  She is not ready to quit.  We will refer her back to pulmonary.  Checking an echo as above.

## 2021-07-28 NOTE — Assessment & Plan Note (Signed)
Blood pressure is now well-controlled.  She was previously on clonidine, diltiazem, and metoprolol.  Her blood pressure stable off medications.

## 2021-07-28 NOTE — Assessment & Plan Note (Signed)
S/p valve replacement x3.  She is now doing well and has no heart failure symptoms.  She knows that she needs to take antibiotics prior to any dental procedures.  She does have some shortness of breath that seems more related to her smoking.  We will get an echo since it has been a few years since it was checked.

## 2021-07-28 NOTE — Patient Instructions (Addendum)
Medication Instructions:  Your physician recommends that you continue on your current medications as directed. Please refer to the Current Medication list given to you today.   *If you need a refill on your cardiac medications before your next appointment, please call your pharmacy*  Lab Work: NONE   Testing/Procedures: Your physician has requested that you have an echocardiogram. Echocardiography is a painless test that uses sound waves to create images of your heart. It provides your doctor with information about the size and shape of your heart and how well your heart's chambers and valves are working. This procedure takes approximately one hour. There are no restrictions for this procedure.  Follow-Up: At [facility], you and your health needs are our priority.  As part of our continuing mission to provide you with exceptional heart care, we have created designated Provider Care Teams.  These Care Teams include your primary Cardiologist (physician) and Advanced Practice Providers (APPs -  Physician Assistants and Nurse Practitioners) who all work together to provide you with the care you need, when you need it.  We recommend signing up for the patient portal called "MyChart".  Sign up information is provided on this After Visit Summary.  MyChart is used to connect with patients for Virtual Visits (Telemedicine).  Patients are able to view lab/test results, encounter notes, upcoming appointments, etc.  Non-urgent messages can be sent to your provider as well.   To learn more about what you can do with MyChart, go to [email or web address].    Your next appointment:   12 month(s)  The format for your next appointment:   In Person  Provider:   DR [place] OR Ronn Melena NP   You have been referred to   Ambulatory referral to Pulmonology  Where: [facility] Address: [address] [place] [address] Phone: [phone] Expires: 07/28/2022 (requested  IF  YOU DO NOT HEAR FROM THEM IN 2 WEEKS YOU CAN CALL THEM DIRECTLY AT NUMBER LISTED ABOVE

## 2021-07-28 NOTE — Progress Notes (Signed)
Cardiology Office Note  Date:  07/28/2021   ID:  Kelly Mora, DOB [birth year], MRN [id number]  PCP:  [facility]  Cardiologist:   Dr. Dorothyann Peng OB/GYN: Dr. Mindi Slicker   No chief complaint on file.     History of Present Illness: Kelly Mora is a 35 y.o. pregnant female with endocarditis status post tricuspid valve replacement x3, hypertension, pulmonary embolism, substance abuse history (none x2 years) and bipolar disorder here for follow up.  Kelly Mora was a patient of Dr. Juliann Pares and last saw him 02/2018.  At that time she was doing well.  She had an echo 09/2017 that revealed LVEF 50% with mild MR, mild TR, and mild PR.  She has a history of substance abuse and endocarditis and underwent tricuspid valve replacement twice in 2014 and once in 2016.  She developed a pulmonary embolism after her second surgery.  Kelly Mora quit smoking at the time she became pregnant.  She continues to abstain from illicits.  She has an appointment with Marjie Skiff, PA-C and complained of some  chest discomfort.  She was referred for [facility] 02/2020 that revealed LVEF 56% and no ischemia.  She had an echo 03/2018 that revealed that her tricuspid valve was stable.  She received 87/22 and had chest pain.  She was noted to have COVID-19 at that time.  She has not had any more chest since then but continues to have shortness of breath.  She notes that she was previously told she had COPD.  She continues to smoke and vape.  She has mostly switched to the vape.  She notes her shortness of breath increased since she started vaping.  She feels tightness in her chest and burning in her throat when she tries to walk.  She notes that she was previously on inhaler and was seen at [facility].  She has been able to successfully wean off all her psychiatric medication and her blood pressure medicines.  When she checks her blood pressure at home it typically is around 120/80.  She has not  been exercising formally but does chase after her young son.  She has no lower extremity edema, orthopnea, or PND.  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     Amphetamine ER (ADZENYS XR-ODT) 12.5 MG TBED Adzenys XR-ODT 12.5 mg extended release disintegrating tablet (Patient not taking: Reported on 07/28/2021)     cloNIDine (CATAPRES) 0.1 MG tablet Take 0.1 mg by mouth 2 (two) times daily. (Patient not taking:  Reported on 07/28/2021)     diltiazem (CARDIZEM) 30 MG tablet Take 30 mg by mouth 2 (two) times daily. (Patient not taking: Reported on 07/28/2021)     FLUoxetine (PROZAC) 40 MG capsule fluoxetine 40 mg capsule (Patient not taking: Reported on 07/28/2021)     FLUoxetine HCl 60 MG TABS fluoxetine 60 mg tablet (Patient not taking: Reported on 07/28/2021)     lamoTRIgine 200 MG TBDP lamotrigine 200 mg disintegrating tablet (Patient not taking: Reported on 07/28/2021)     metoprolol tartrate (LOPRESSOR) 25 MG tablet Take 25 mg by mouth 2 (two) times daily. (Patient not taking: Reported on 07/28/2021)     No current  facility-administered medications for this visit.    Allergies:   Kiwi extract, Pineapple, Pregabalin, Sulfa antibiotics, and Tramadol    Social History:  The patient  reports that she has been smoking cigarettes. She has been smoking an average of .5 packs per day. She has never used smokeless tobacco. She reports that she does not currently use drugs. She reports that she does not drink alcohol.   Family History:  The patient's family history includes Diabetes in her father; Skin cancer in her maternal grandmother.    ROS:  Please see the history of present illness.   Otherwise, review of systems are positive for none.   All other systems are reviewed and negative.    PHYSICAL EXAM: VS:  BP 122/82   Pulse 73   Ht 5' 4.5" (1.638 m)   Wt 182 lb 11.2 oz (82.9 kg)   BMI 30.88 kg/m  , BMI Body mass index is 30.88 kg/m. GENERAL:  Well appearing.  No acute distress.  HEENT:  Pupils equal round and reactive, fundi not visualized, oral mucosa unremarkable NECK:  No jugular venous distention, waveform within normal limits, carotid upstroke brisk and symmetric, no bruits LUNGS:  Clear to auscultation bilaterally HEART:  RRR.  PMI not displaced or sustained,S1 and S2 within normal limits, no S3, no S4, no clicks, no rubs, no murmurs ABD:  Gravid uterus. Positive bowel sounds normal in frequency in pitch, no bruits, no rebound, no guarding, no midline pulsatile mass, no hepatomegaly, no splenomegaly EXT:  2 plus pulses throughout, no edema, no cyanosis no clubbing SKIN:  No rashes no nodules NEURO:  Cranial nerves II through XII grossly intact, motor grossly intact throughout PSYCH:  Cognitively intact, oriented to person place and time   EKG:  EKG is not ordered today. The ekg ordered today demonstrates sinus rhythm.  Rate 61 bpm.  Incomplete RBBB.   Lexiscan Myoview 02/26/20: The left ventricular ejection fraction is normal (55-65%). Nuclear stress EF: 56%. There was no ST segment  deviation noted during stress. The study is normal. This is a low risk study.   Normal stress nuclear study with no ischemia or infarction; gated EF 56 with normal wall motion.   Echo 03/20/18: Study Conclusions   - Left ventricle: The cavity size was normal. Wall thickness was    normal. Systolic function was normal. The estimated ejection    fraction was in the range of 60% to 65%.  - Mitral valve: There was mild regurgitation.  - Tricuspid valve: A bioprosthesis was present.   Recent Labs: 05/06/2021: BUN 16; Creatinine, Ser 0.89; Hemoglobin 15.8; Platelets 127; Potassium 3.8; Sodium 137 05/07/2021: ALT 23    Lipid Panel No results found for: CHOL, TRIG, HDL, CHOLHDL, VLDL, LDLCALC, LDLDIRECT    Wt Readings from Last 3 Encounters:  07/28/21 182  lb 11.2 oz (82.9 kg)  05/06/21 165 lb (74.8 kg)  06/14/20 160 lb (72.6 kg)      ASSESSMENT AND PLAN:  Essential hypertension Blood pressure is now well-controlled.  She was previously on clonidine, diltiazem, and metoprolol.  Her blood pressure stable off medications.  Bipolar disorder (HCC) Well-controlled off medications.  She continues to see her therapist.  S/P tricuspid valve replacement S/p valve replacement x3.  She is now doing well and has no heart failure symptoms.  She knows that she needs to take antibiotics prior to any dental procedures.  She does have some shortness of breath that seems more related to her smoking.  We will get an echo since it has been a few years since it was checked.  Shortness of breath Likely related to smoking and vaping.  She is not ready to quit.  We will refer her back to pulmonary.  Checking an echo as above.    Current medicines are reviewed at length with the patient today.  The patient does not have concerns regarding medicines.  The following changes have been made:  no change  Labs/ tests ordered today include:   No orders of the defined types were placed in this  encounter.    Disposition:   FU with Adlyn Fife C. Duke Salvia, MD, [facility] in 1 year   Signed, Eulala Newcombe C. Duke Salvia, MD, [facility]  07/28/2021 8:21 AM    [facility]

## 2021-08-08 ENCOUNTER — Other Ambulatory Visit: Payer: Self-pay

## 2021-08-08 ENCOUNTER — Ambulatory Visit (INDEPENDENT_AMBULATORY_CARE_PROVIDER_SITE_OTHER): Payer: Medicare Other

## 2021-08-08 DIAGNOSIS — Z954 Presence of other heart-valve replacement: Secondary | ICD-10-CM | POA: Diagnosis not present

## 2021-08-08 LAB — ECHOCARDIOGRAM COMPLETE
AR max vel: 2.78 cm2
AV Area VTI: 2.59 cm2
AV Area mean vel: 2.69 cm2
AV Mean grad: 3 mmHg
AV Peak grad: 5.2 mmHg
Ao pk vel: 1.14 m/s
Area-P 1/2: 4.44 cm2
Calc EF: 62 %
S' Lateral: 2.96 cm
Single Plane A2C EF: 61.6 %
Single Plane A4C EF: 62.7 %

## 2021-08-25 ENCOUNTER — Other Ambulatory Visit: Payer: Self-pay

## 2021-08-25 ENCOUNTER — Ambulatory Visit (INDEPENDENT_AMBULATORY_CARE_PROVIDER_SITE_OTHER): Payer: Medicare Other | Admitting: Pulmonary Disease

## 2021-08-25 ENCOUNTER — Encounter: Payer: Self-pay | Admitting: Pulmonary Disease

## 2021-08-25 VITALS — BP 118/84 | HR 84 | Ht 64.5 in | Wt 182.0 lb

## 2021-08-25 DIAGNOSIS — F17290 Nicotine dependence, other tobacco product, uncomplicated: Secondary | ICD-10-CM

## 2021-08-25 DIAGNOSIS — R0602 Shortness of breath: Secondary | ICD-10-CM

## 2021-08-25 MED ORDER — TRELEGY ELLIPTA 100-62.5-25 MCG/ACT IN AEPB: 1.0000 | INHALATION_SPRAY | Freq: Every day | RESPIRATORY_TRACT | 0 refills | Status: DC

## 2021-08-25 MED ORDER — ALBUTEROL SULFATE HFA 108 (90 BASE) MCG/ACT IN AERS: 2.0000 | INHALATION_SPRAY | Freq: Four times a day (QID) | RESPIRATORY_TRACT | 6 refills | Status: AC | PRN

## 2021-08-25 NOTE — Progress Notes (Signed)
Synopsis: Referred in October 2022 for Shortness of breath by Chilton Si, MD  Subjective:   PATIENT ID: Kelly Mora GENDER: female DOB: [birth year], MRN: [id number]   HPI  Chief Complaint  Patient presents with   Consult    Referred by Dr. Duke Salvia for SOB with exertion for the past 4 years. She was diagnosed with possible COPD in the same timeframe.    Kelly Mora is a 35 year old woman, daily vapor with history of endocarditis s/p bioprosthetic valve who is referred to pulmonary clinic for shortness of breath.   She reports having shortness of breath over the past 4 to 5 years which feels progressive over recent months.  She notices shortness of breath when she is active playing with her toddler aged son.  She reports having COVID-19 infection in July 2022.  She denies any cough and has intermittent wheezing.  She denies any nighttime awakenings from shortness of breath, cough or wheezing.  She is a former cigarette smoker and is currently vaping nicotine daily.  She refills her vape pen twice per week. She has noticed increasing dyspnea since vaping compared to using cigarettes.  She reports having chest tubes and septic pulmonary emboli when she was hospitalized for endocarditis.   She owns a tattoo shop. No second hand smoke exposure currently. No hazardous dust or chemical exposures through previous work.   She was followed by [facility] previously and PFTs from 04/22/2017 are below.   FVC was 2.84 liters, 82% of predicted/Post 2.72, 79%, -4% Change FEV1 was 2.22, 75% of predicted/Post 2.21, 74%, -0% Change FEV1 ratio was 78/Post 81 FEF 25-75% liters per second was 55% of predicted/Post 64%, 17% Change *SVN given with 2.5 mg Albuterol for Post Spirometry  Chest radiograph from 05/27/21 is without acute abnormalities.   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   Bioprosthetic   Shortness of breath 07/28/2021   Vaping nicotine dependence, tobacco product 07/28/2021     Family History  Problem Relation Age of Onset   Diabetes Father    Skin cancer Maternal Grandmother      Social History   Socioeconomic History   Marital status: Single    Spouse name: Not on file   Number of children: Not on file   Years of education: Not on file   Highest education level: Not on file  Occupational History   Not on file  Tobacco Use   Smoking status: Some Days    Packs/day: 0.50    Types: Cigarettes   Smokeless tobacco: Never   Tobacco comments:    vapor  Substance and Sexual Activity   Alcohol use: No   Drug use: Not Currently   Sexual activity: Yes    Birth control/protection: None, I.U.D.  Other Topics Concern   Not on file  Social History Narrative   ** Merged History Encounter **       Social Determinants of Health   Financial Resource Strain: Not on file  Food Insecurity: Not on file  Transportation Needs: Not on file  Physical Activity: Not on file  Stress: Not on file  Social Connections: Not on file  Intimate Partner Violence: Not on file     Allergies  Allergen Reactions   Kiwi Extract Swelling  Pineapple Swelling   Pregabalin Hives   Sulfa Antibiotics Hives   Tramadol Hives     Outpatient Medications Prior to Visit  Medication Sig Dispense Refill   levonorgestrel (MIRENA, 52 MG,) 20 MCG/24HR IUD Mirena 20 mcg/24 hours (6 yrs) 52 mg intrauterine device  Take 1 device by intrauterine route.     Amphetamine ER (ADZENYS XR-ODT) 12.5 MG TBED Adzenys XR-ODT 12.5 mg extended release disintegrating tablet (Patient not taking: Reported on 07/28/2021)     FLUoxetine (PROZAC) 40 MG capsule fluoxetine 40 mg capsule (Patient not taking: Reported on 07/28/2021)     FLUoxetine HCl 60 MG TABS fluoxetine 60 mg tablet (Patient not  taking: Reported on 07/28/2021)     lamoTRIgine 200 MG TBDP lamotrigine 200 mg disintegrating tablet (Patient not taking: Reported on 07/28/2021)     No facility-administered medications prior to visit.    Review of Systems  Constitutional:  Negative for chills, fever, malaise/fatigue and weight loss.  HENT:  Negative for congestion, sinus pain and sore throat.   Eyes: Negative.   Respiratory:  Positive for shortness of breath. Negative for cough, hemoptysis, sputum production and wheezing.   Cardiovascular:  Negative for chest pain, palpitations, orthopnea, claudication and leg swelling.  Gastrointestinal:  Negative for abdominal pain, heartburn, nausea and vomiting.  Genitourinary: Negative.   Musculoskeletal:  Negative for joint pain and myalgias.  Skin:  Negative for rash.  Neurological:  Negative for weakness.  Endo/Heme/Allergies: Negative.   Psychiatric/Behavioral: Negative.     Objective:   Vitals:   08/25/21 1009  BP: 118/84  Pulse: 84  SpO2: 99%  Weight: 182 lb (82.6 kg)  Height: 5' 4.5" (1.638 m)    Physical Exam Constitutional:      General: She is not in acute distress.    Appearance: She is not ill-appearing.  HENT:     Head: Normocephalic and atraumatic.  Eyes:     General: No scleral icterus.    Conjunctiva/sclera: Conjunctivae normal.     Pupils: Pupils are equal, round, and reactive to light.  Cardiovascular:     Rate and Rhythm: Normal rate and regular rhythm.     Pulses: Normal pulses.     Heart sounds: Normal heart sounds. No murmur heard. Pulmonary:     Effort: Pulmonary effort is normal.     Breath sounds: Normal breath sounds. No wheezing, rhonchi or rales.  Abdominal:     General: Bowel sounds are normal.     Palpations: Abdomen is soft.  Musculoskeletal:     Right lower leg: No edema.     Left lower leg: No edema.  Lymphadenopathy:     Cervical: No cervical adenopathy.  Skin:    General: Skin is warm and dry.  Neurological:      General: No focal deficit present.     Mental Status: She is alert.  Psychiatric:        Mood and Affect: Mood normal.        Behavior: Behavior normal.        Thought Content: Thought content normal.        Judgment: Judgment normal.   CBC    Component Value Date/Time   WBC 4.9 05/06/2021 2221   RBC 5.17 (H) 05/06/2021 2221   HGB 15.8 (H) 05/06/2021 2221   HCT 45.7 05/06/2021 2221   PLT 127 (L) 05/06/2021 2221   MCV 88.4 05/06/2021 2221   MCH 30.6 05/06/2021 2221   MCHC 34.6 05/06/2021 2221   RDW 12.5  05/06/2021 2221   LYMPHSABS 1.1 12/19/2015 2234   MONOABS 0.6 12/19/2015 2234   EOSABS 0.0 12/19/2015 2234   BASOSABS 0.0 12/19/2015 2234   BMP Latest Ref Rng & Units 05/06/2021 07/04/2018 03/27/2018  Glucose 70 - 99 mg/dL 71 87 588(F)  BUN 6 - 20 mg/dL 16 15 02(D)  Creatinine 0.44 - 1.00 mg/dL 7.41 2.87 8.67  Sodium 135 - 145 mmol/L 137 138 142  Potassium 3.5 - 5.1 mmol/L 3.8 4.0 4.4  Chloride 98 - 111 mmol/L 102 101 107  CO2 22 - 32 mmol/L 25 26 25   Calcium 8.9 - 10.3 mg/dL 9.9 9.4 9.0   Chest imaging: CXR 05/06/21 Post median sternotomy. Heart is upper normal in size, stable. Unchanged mediastinal contours. Occasional areas of scarring and interstitial coarsening, unchanged. No confluent airspace disease, pleural effusion, or pneumothorax. No pulmonary edema. Right axillary surgical clips.  PFT: No flowsheet data found.  Labs:  Path:  Echo 08/08/21: LV EF 60-65%. RV systolic function is mildly reduced. Tricuspid Valve replaced with mosaic 6mm mitral valve.  Heart Catheterization:  Assessment & Plan:   Shortness of breath - Plan: Pulmonary Function Test, albuterol (VENTOLIN HFA) 108 (90 Base) MCG/ACT inhaler  Vaping nicotine dependence, tobacco product  Discussion: Armetta Henri is a 35 year old woman, daily vapor with history of endocarditis s/p bioprosthetic valve who is referred to pulmonary clinic for shortness of breath.   Given she has dyspnea with  intermittent exertion there is concern for reactive airways disease. There is also concern for restrictive defect as suggested by her spirometry from 2018 at [facility]. We will provide her with a sample of trelegy ellipta and monitor for any improvement. She can also use albuterol inhaler as needed.   Her dyspnea is also related to ongoing vaping. We discussed nicotine cessation options such as varenicline and wellbutrin. We also discussed nicotine replacement therapy with patches, gum and lozenges. She is not motivated to quit vaping at this time. She does not wish to try medications but would prefer the nicotine replacement route.   She is to follow up in 1 month for pulmonary function tests.  [facility], MD [facility] Office: [phone]   Current Outpatient Medications:    albuterol (VENTOLIN HFA) 108 (90 Base) MCG/ACT inhaler, Inhale 2 puffs into the lungs every 6 (six) hours as needed for wheezing or shortness of breath., Disp: 8 g, Rfl: 6   Fluticasone-Umeclidin-Vilant (TRELEGY ELLIPTA) 100-62.5-25 MCG/ACT AEPB, Inhale 1 puff into the lungs daily., Disp: 1 each, Rfl: 0   levonorgestrel (MIRENA, 52 MG,) 20 MCG/24HR IUD, Mirena 20 mcg/24 hours (6 yrs) 52 mg intrauterine device  Take 1 device by intrauterine route., Disp: , Rfl:

## 2021-08-25 NOTE — Patient Instructions (Signed)
Use albuterol inhaler 1-2 puffs every 4-6 hours as needed for shortness of breath or burning sensation  Use Trelegy ellipta 1 puff daily over the next couple of weeks and monitor for improvement in your shortness of breath. Please call our office if you notice improvement and we will send in a prescription. - rinse mouth out after each use  Recommend using nicotine patches, starting with the 21mg  patch daily followed by 14mg  and then 7mg . You can taper the dose as you feel ready.  - Reduce your vaping use, for example 1 refill per week instead of 2 refills per week.  - Then continue try 1 refill per 2 weeks, and then 1 refill per month eventually  Follow up in 1 month for pulmonary function tests.

## 2021-08-26 ENCOUNTER — Encounter: Payer: Self-pay | Admitting: Pulmonary Disease

## 2021-09-16 ENCOUNTER — Other Ambulatory Visit (HOSPITAL_BASED_OUTPATIENT_CLINIC_OR_DEPARTMENT_OTHER): Payer: Self-pay | Admitting: *Deleted

## 2021-09-16 DIAGNOSIS — Z954 Presence of other heart-valve replacement: Secondary | ICD-10-CM

## 2021-10-03 ENCOUNTER — Ambulatory Visit: Payer: Medicare Other | Admitting: Adult Health

## 2021-10-25 ENCOUNTER — Other Ambulatory Visit: Payer: Self-pay | Admitting: Internal Medicine

## 2021-10-25 LAB — SARS CORONAVIRUS 2 (TAT 6-24 HRS): SARS Coronavirus 2: NEGATIVE

## 2021-10-28 ENCOUNTER — Other Ambulatory Visit: Payer: Self-pay

## 2021-10-28 ENCOUNTER — Ambulatory Visit (INDEPENDENT_AMBULATORY_CARE_PROVIDER_SITE_OTHER): Payer: Medicare Other

## 2021-10-28 ENCOUNTER — Ambulatory Visit (INDEPENDENT_AMBULATORY_CARE_PROVIDER_SITE_OTHER): Payer: Medicare Other | Admitting: Pulmonary Disease

## 2021-10-28 ENCOUNTER — Ambulatory Visit (INDEPENDENT_AMBULATORY_CARE_PROVIDER_SITE_OTHER): Payer: Medicare Other | Admitting: Adult Health

## 2021-10-28 ENCOUNTER — Encounter: Payer: Self-pay | Admitting: Adult Health

## 2021-10-28 VITALS — BP 108/70 | HR 79 | Temp 98.1°F | Ht 64.0 in | Wt 185.2 lb

## 2021-10-28 DIAGNOSIS — J4530 Mild persistent asthma, uncomplicated: Secondary | ICD-10-CM

## 2021-10-28 DIAGNOSIS — F17290 Nicotine dependence, other tobacco product, uncomplicated: Secondary | ICD-10-CM

## 2021-10-28 DIAGNOSIS — R0602 Shortness of breath: Secondary | ICD-10-CM

## 2021-10-28 DIAGNOSIS — J45909 Unspecified asthma, uncomplicated: Secondary | ICD-10-CM | POA: Insufficient documentation

## 2021-10-28 LAB — CBC WITH DIFFERENTIAL/PLATELET
Basophils Absolute: 0 10*3/uL (ref 0.0–0.1)
Basophils Relative: 0.5 % (ref 0.0–3.0)
Eosinophils Absolute: 0.1 10*3/uL (ref 0.0–0.7)
Eosinophils Relative: 1.7 % (ref 0.0–5.0)
HCT: 43.8 % (ref 36.0–46.0)
Hemoglobin: 14.3 g/dL (ref 12.0–15.0)
Lymphocytes Relative: 21.6 % (ref 12.0–46.0)
Lymphs Abs: 1.7 10*3/uL (ref 0.7–4.0)
MCHC: 32.7 g/dL (ref 30.0–36.0)
MCV: 93.3 fl (ref 78.0–100.0)
Monocytes Absolute: 0.6 10*3/uL (ref 0.1–1.0)
Monocytes Relative: 7.5 % (ref 3.0–12.0)
Neutro Abs: 5.4 10*3/uL (ref 1.4–7.7)
Neutrophils Relative %: 68.7 % (ref 43.0–77.0)
Platelets: 178 10*3/uL (ref 150.0–400.0)
RBC: 4.69 Mil/uL (ref 3.87–5.11)
RDW: 13.6 % (ref 11.5–15.5)
WBC: 7.9 10*3/uL (ref 4.0–10.5)

## 2021-10-28 LAB — PULMONARY FUNCTION TEST
DL/VA % pred: 102 %
DL/VA: 4.67 ml/min/mmHg/L
DLCO cor % pred: 82 %
DLCO cor: 18.33 ml/min/mmHg
DLCO unc % pred: 82 %
DLCO unc: 18.33 ml/min/mmHg
FEF 25-75 Post: 2.43 L/sec
FEF 25-75 Pre: 2.13 L/sec
FEF2575-%Change-Post: 14 %
FEF2575-%Pred-Post: 72 %
FEF2575-%Pred-Pre: 63 %
FEV1-%Change-Post: 4 %
FEV1-%Pred-Post: 81 %
FEV1-%Pred-Pre: 77 %
FEV1-Post: 2.56 L
FEV1-Pre: 2.44 L
FEV1FVC-%Change-Post: 9 %
FEV1FVC-%Pred-Pre: 92 %
FEV6-%Change-Post: -3 %
FEV6-%Pred-Post: 80 %
FEV6-%Pred-Pre: 84 %
FEV6-Post: 3.01 L
FEV6-Pre: 3.13 L
FEV6FVC-%Change-Post: 0 %
FEV6FVC-%Pred-Post: 101 %
FEV6FVC-%Pred-Pre: 100 %
FVC-%Change-Post: -4 %
FVC-%Pred-Post: 80 %
FVC-%Pred-Pre: 83 %
FVC-Post: 3.01 L
FVC-Pre: 3.14 L
Post FEV1/FVC ratio: 85 %
Post FEV6/FVC ratio: 100 %
Pre FEV1/FVC ratio: 78 %
Pre FEV6/FVC Ratio: 100 %
RV % pred: 112 %
RV: 1.63 L
TLC % pred: 91 %
TLC: 4.62 L

## 2021-10-28 LAB — BASIC METABOLIC PANEL
BUN: 17 mg/dL (ref 6–23)
CO2: 26 mEq/L (ref 19–32)
Calcium: 9.4 mg/dL (ref 8.4–10.5)
Chloride: 105 mEq/L (ref 96–112)
Creatinine, Ser: 0.8 mg/dL (ref 0.40–1.20)
GFR: 96.52 mL/min (ref >60.00)
Glucose, Bld: 88 mg/dL (ref 70–99)
Potassium: 4.1 mEq/L (ref 3.5–5.1)
Sodium: 138 mEq/L (ref 135–145)

## 2021-10-28 MED ORDER — BUDESONIDE-FORMOTEROL FUMARATE 80-4.5 MCG/ACT IN AERO: 2.0000 | INHALATION_SPRAY | Freq: Two times a day (BID) | RESPIRATORY_TRACT | 5 refills | Status: DC

## 2021-10-28 NOTE — Assessment & Plan Note (Addendum)
Shortness of breath suspect is multifactorial.  Has had chronic dyspnea over the last several years. She did have COVID in July.  We will check chest x-ray for possible any post inflammatory scarring.  However PFT showed no significant airflow obstruction or restriction.  And DLCO is normal. Most recent echo per cardiology appeared stable. We will check a D-dimer as she is on birth control.  And has a history of PE.  (Septic emboli from endocarditis) However low suspicion as she has no hemoptysis, calf pain, hypoxemia.  Will begin Symbicort and trigger prevention.  Plan  Patient Instructions  Remain off TRELEGY inhaler .  Begin Symbicort 80 2 puffs Twice daily, rinse after use.  Albuterol inhaler As needed   Chest xray and labs today .  Begin Zyrtec 10mg  At bedtime   Begin Pepcid 20mg  Twice daily   Activity as tolerated Avoid throat clearing and coughing , use sips of water to soothe of water .  Delsym 2 tsp Twice daily  As needed  cough  Follow up with Dr. in 6 weeks and As needed   Please contact office for sooner follow up if symptoms do not improve or worsen or seek emergency care      Late add, Lab unable to draw enough blood for panel . Several attempts made. Advised patient to refer to our central lab at [facility] [address]. Patient refused. Advised D Dimer not completed, advised needs to go to [facility] lab.

## 2021-10-28 NOTE — Assessment & Plan Note (Signed)
PFT show no significant restriction /obstruction. Symptoms may be component of asthma /RAD .  Ongoing vaping is an irritant , discussed cessation  Check chest xray today   Plan Patient Instructions  Remain off TRELEGY inhaler .  Begin Symbicort 80 2 puffs Twice daily, rinse after use.  Albuterol inhaler As needed   Chest xray and labs today .  Begin Zyrtec 10mg  At bedtime   Begin Pepcid 20mg  Twice daily   Activity as tolerated Avoid throat clearing and coughing , use sips of water to soothe of water .  Delsym 2 tsp Twice daily  As needed  cough  Follow up with Dr. in 6 weeks and As needed   Please contact office for sooner follow up if symptoms do not improve or worsen or seek emergency care

## 2021-10-28 NOTE — Addendum Note (Signed)
Addended by: Julio Sicks on: 10/28/2021 12:01 PM   Modules accepted: Orders

## 2021-10-28 NOTE — Assessment & Plan Note (Signed)
Cessation discussed 

## 2021-10-28 NOTE — Patient Instructions (Addendum)
Remain off TRELEGY inhaler .  Begin Symbicort 80 2 puffs Twice daily, rinse after use.  Albuterol inhaler As needed   Chest xray and labs today .  Begin Zyrtec 10mg  At bedtime   Begin Pepcid 20mg  Twice daily   Activity as tolerated Avoid throat clearing and coughing , use sips of water to soothe of water .  Delsym 2 tsp Twice daily  As needed  cough  Follow up with Dr. in 6 weeks and As needed   Please contact office for sooner follow up if symptoms do not improve or worsen or seek emergency care

## 2021-10-28 NOTE — Progress Notes (Signed)
@Patient  ID: Kelly Mora, female    DOB: August 19, 1986, 35 y.o.   MRN: [id number]  Chief Complaint  Patient presents with   Follow-up    Referring provider: [facility]  HPI: 35 year old female active smoker-vapes daily seen for pulmonary consult August 25, 2021 for shortness of breath Medical history significant for history of endocarditis status post bioprosthetic tricuspid valve x 3 history of critical illness with endocarditis with septic pulmonary emboli) COVID-19 infection July 2022 Followed at [facility] pulmonary History of substance abuse (remission since 2016) , bipolar disorder   TEST/EVENTS :  FVC was 2.84 liters, 82% of predicted/Post 2.72, 79%, -4% Change FEV1 was 2.22, 75% of predicted/Post 2.21, 74%, -0% Change FEV1 ratio was 78/Post 81 FEF 25-75% liters per second was 55% of predicted/Post 64%, 17% Change *SVN given with 2.5 mg Albuterol for Post Spirometry  10/28/2021 Follow up : Dyspnea  Patient presents for a 61-month follow-up.  Patient was seen last visit for pulmonary consult for ongoing shortness of breath.  Patient is an active smoker/vapes daily.  She was set up for pulmonary function testing that showed normal lung function with no significant restriction or airflow obstruction.  FEV1 81%, ratio 85, FVC 80%, no significant bronchodilator response, total lung capacity 91%, DLCO 82%. Feels she is short of breath , tightness and burning in chest. Has low activity tolerance , worse with cold air . Has been going on for 8 yr after since Tricuspid Valve replacement . Seems like worse for last 2-3 years. Previously on inhalers in past for COPD . Was started on Trelegy in last visit , could not tolerate,irritated her throat. Has tried Spiriva in past , did not tolerate.  Has 22 year old son, hard to keep up with him. Has intermittent cough and wheezing .  Throat clears often and has post nasal drainage.   On birth control . No chance of pregnancy . No calf  pain , hemoptysis . O2 sats 96 % on ra.    Patient has known history of endocarditis with previous tricuspid valve replacement. She is followed by cardiology recent echo on August 08, 2021 showed EF at 60 to 65%, right ventricular systolic function mildly reduced.  Was noted pressure across her tricuspid valve was slightly elevated but felt to be stable with plans for a follow-up echo in 1 year.    Allergies  Allergen Reactions   Kiwi Extract Swelling   Pineapple Swelling   Pregabalin Hives   Sulfa Antibiotics Hives   Tramadol Hives    There is no immunization history for the selected administration types on file for this patient.  Past Medical History:  Diagnosis Date   Bipolar disorder ([place]) 03/14/2018   Endocarditis    drug use   Essential hypertension 03/14/2018   Narcotic abuse in remission ([place]) 03/14/2018   Neuropathy    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   Bioprosthetic   Shortness of breath 07/28/2021   Vaping nicotine dependence, tobacco product 07/28/2021    Tobacco History: Social History   Tobacco Use  Smoking Status Some Days   Packs/day: 0.50   Types: Cigarettes  Smokeless Tobacco Never  Tobacco Comments   Vapor   Vaping more than smoking.  Rarely smokes.  Tried salt nic. Went 2 weeks with out smoking.  Went down in level of vaping.  10/28/21 hfb   Ready to quit: Not Answered Counseling given: Not Answered Tobacco comments: Vapor Vaping more than smoking.  Rarely smokes.  Tried  salt nic. Went 2 weeks with out smoking.  Went down in level of vaping.  10/28/21 hfb   Outpatient Medications Prior to Visit  Medication Sig Dispense Refill   albuterol (VENTOLIN HFA) 108 (90 Base) MCG/ACT inhaler Inhale 2 puffs into the lungs every 6 (six) hours as needed for wheezing or shortness of breath. 8 g 6   levonorgestrel (MIRENA, 52 MG,) 20 MCG/24HR IUD Mirena 20 mcg/24 hours (6 yrs) 52 mg intrauterine device  Take 1 device by intrauterine route.      Fluticasone-Umeclidin-Vilant (TRELEGY ELLIPTA) 100-62.5-25 MCG/ACT AEPB Inhale 1 puff into the lungs daily. (Patient not taking: Reported on 10/28/2021) 1 each 0   No facility-administered medications prior to visit.     Review of Systems:   Constitutional:   No  weight loss, night sweats,  Fevers, chills,  +fatigue, or  lassitude.  HEENT:   No headaches,  Difficulty swallowing,  Tooth/dental problems, or  Sore throat,                No sneezing, itching, ear ache, nasal congestion, post nasal drip,   CV:  No chest pain,  Orthopnea, PND, swelling in lower extremities, anasarca, dizziness, palpitations, syncope.   GI  No heartburn, indigestion, abdominal pain, nausea, vomiting, diarrhea, change in bowel habits, loss of appetite, bloody stools.   Resp:   No chest wall deformity  Skin: no rash or lesions.  GU: no dysuria, change in color of urine, no urgency or frequency.  No flank pain, no hematuria   MS:  No joint pain or swelling.  No decreased range of motion.  No back pain.    Physical Exam  BP 108/70 (BP Location: Left Arm, Patient Position: Sitting, Cuff Size: Normal)    Pulse 79    Temp 98.1 F (36.7 C) (Oral)    Ht 5\' 4"  (1.626 m)    Wt 185 lb 3.2 oz (84 kg)    SpO2 96%    BMI 31.79 kg/m   GEN: A/Ox3; pleasant , NAD, well nourished    HEENT:  [place]/AT,  NOSE-clear, THROAT-clear, no lesions, no postnasal drip or exudate noted.   NECK:  Supple w/ fair ROM; no JVD; normal carotid impulses w/o bruits; no thyromegaly or nodules palpated; no lymphadenopathy.    RESP  Clear  P & A; w/o, wheezes/ rales/ or rhonchi. no accessory muscle use, no dullness to percussion  CARD:  RRR, no m/r/g, no peripheral edema, pulses intact, no cyanosis or clubbing.  GI:   Soft & nt; nml bowel sounds; no organomegaly or masses detected.   Musco: Warm bil, no deformities or joint swelling noted.   Neuro: alert, no focal deficits noted.    Skin: Warm, no lesions or rashes, tattoos    Lab  Results:  CBC    Component Value Date/Time   WBC 4.9 05/06/2021 2221   RBC 5.17 (H) 05/06/2021 2221   HGB 15.8 (H) 05/06/2021 2221   HCT 45.7 05/06/2021 2221   PLT 127 (L) 05/06/2021 2221   MCV 88.4 05/06/2021 2221   MCH 30.6 05/06/2021 2221   MCHC 34.6 05/06/2021 2221   RDW 12.5 05/06/2021 2221   LYMPHSABS 1.1 12/19/2015 2234   MONOABS 0.6 12/19/2015 2234   EOSABS 0.0 12/19/2015 2234   BASOSABS 0.0 12/19/2015 2234    BMET    Component Value Date/Time   NA 137 05/06/2021 2221   K 3.8 05/06/2021 2221   CL 102 05/06/2021 2221   CO2 25 05/06/2021  2221   GLUCOSE 71 05/06/2021 2221   BUN 16 05/06/2021 2221   CREATININE 0.89 05/06/2021 2221   CALCIUM 9.9 05/06/2021 2221   GFRNONAA >60 05/06/2021 2221   GFRAA >60 07/04/2018 2149    BNP    Component Value Date/Time   BNP 52.4 03/27/2018 1959    ProBNP No results found for: PROBNP  Imaging: No results found.    PFT Results Latest Ref Rng & Units 10/28/2021  FVC-Pre L 3.14  FVC-Predicted Pre % 83  FVC-Post L 3.01  FVC-Predicted Post % 80  Pre FEV1/FVC % % 78  Post FEV1/FCV % % 85  FEV1-Pre L 2.44  FEV1-Predicted Pre % 77  FEV1-Post L 2.56  DLCO uncorrected ml/min/mmHg 18.33  DLCO UNC% % 82  DLCO corrected ml/min/mmHg 18.33  DLCO COR %Predicted % 82  DLVA Predicted % 102  TLC L 4.62  TLC % Predicted % 91  RV % Predicted % 112    No results found for: NITRICOXIDE      Assessment & Plan:   Asthma PFT show no significant restriction /obstruction. Symptoms may be component of asthma /RAD .  Ongoing vaping is an irritant , discussed cessation  Check chest xray today   Plan Patient Instructions  Remain off TRELEGY inhaler .  Begin Symbicort 80 2 puffs Twice daily, rinse after use.  Albuterol inhaler As needed   Chest xray and labs today .  Begin Zyrtec 10mg  At bedtime   Begin Pepcid 20mg  Twice daily   Activity as tolerated Avoid throat clearing and coughing , use sips of water to soothe of  water .  Delsym 2 tsp Twice daily  As needed  cough  Follow up with Dr. Erin Fulling in 6 weeks and As needed   Please contact office for sooner follow up if symptoms do not improve or worsen or seek emergency care        Shortness of breath Shortness of breath suspect is multifactorial.  Has had chronic dyspnea over the last several years. She did have COVID in July.  We will check chest x-ray for possible any post inflammatory scarring.  However PFT showed no significant airflow obstruction or restriction.  And DLCO is normal. Most recent echo per cardiology appeared stable. We will check a D-dimer as she is on birth control.  And has a history of PE.  (Septic emboli from endocarditis) However low suspicion as she has no hemoptysis, calf pain, hypoxemia.  Will begin Symbicort and trigger prevention.  Plan  Patient Instructions  Remain off TRELEGY inhaler .  Begin Symbicort 80 2 puffs Twice daily, rinse after use.  Albuterol inhaler As needed   Chest xray and labs today .  Begin Zyrtec 10mg  At bedtime   Begin Pepcid 20mg  Twice daily   Activity as tolerated Avoid throat clearing and coughing , use sips of water to soothe of water .  Delsym 2 tsp Twice daily  As needed  cough  Follow up with Dr. Erin Fulling in 6 weeks and As needed   Please contact office for sooner follow up if symptoms do not improve or worsen or seek emergency care        Vaping nicotine dependence, tobacco product Cessation discussed  I spent   40 minutes dedicated to the care of this patient on the date of this encounter to include pre-visit review of records, face-to-face time with the patient discussing conditions above, post visit ordering of testing, clinical documentation with the electronic health record,  making appropriate referrals as documented, and communicating necessary findings to members of the patients care team.    Rexene Edison, NP 10/28/2021

## 2021-10-28 NOTE — Progress Notes (Signed)
PFT done today. 

## 2021-10-28 NOTE — Addendum Note (Signed)
Addended by: Demetrio Lapping E on: 10/28/2021 11:31 AM   Modules accepted: Orders

## 2021-12-09 ENCOUNTER — Ambulatory Visit: Payer: Medicare Other | Admitting: Pulmonary Disease

## 2021-12-22 ENCOUNTER — Other Ambulatory Visit: Payer: Self-pay

## 2021-12-22 ENCOUNTER — Encounter: Payer: Self-pay | Admitting: Pulmonary Disease

## 2021-12-22 ENCOUNTER — Ambulatory Visit (INDEPENDENT_AMBULATORY_CARE_PROVIDER_SITE_OTHER): Payer: Medicare Other | Admitting: Pulmonary Disease

## 2021-12-22 VITALS — BP 118/80 | HR 77 | Ht 64.0 in | Wt 183.0 lb

## 2021-12-22 DIAGNOSIS — J4520 Mild intermittent asthma, uncomplicated: Secondary | ICD-10-CM

## 2021-12-22 DIAGNOSIS — F17290 Nicotine dependence, other tobacco product, uncomplicated: Secondary | ICD-10-CM

## 2021-12-22 NOTE — Progress Notes (Signed)
Synopsis: Referred in October 2022 for Shortness of breath by Chilton Si, MD  Subjective:   PATIENT ID: Kelly Mora GENDER: female DOB: [birth year], MRN: [id number]  HPI  Chief Complaint  Patient presents with   Follow-up    2 mo f/u. States the Trelegy and Symbicort both made her throat to itch. Breathing has remained the same since last visit.    Kelly Mora is a 36 year old woman, daily vapor with history of endocarditis s/p bioprosthetic valve who returns to pulmonary clinic for shortness of breath.   Follow up visit 10/28/21 with Rubye Oaks, NP reviewed. Her PFTs were within normal limits and chest radiograph was unremarkable at that visit. She was started on symbicort 80-4.64mcg 2 puffs twice daily as she did not tolerate the trelegy ellipta sample and did not tolerate spiriva in the past. She did not tolerate symbicort either due to throat irritation and runny nose symptoms.  She continues to vape, but has decreased her nicotine amount per cartridge.    OV 08/25/21 She reports having shortness of breath over the past 4 to 5 years which feels progressive over recent months.  She notices shortness of breath when she is active playing with her toddler aged son.  She reports having COVID-19 infection in July 2022.  She denies any cough and has intermittent wheezing.  She denies any nighttime awakenings from shortness of breath, cough or wheezing.  She is a former cigarette smoker and is currently vaping nicotine daily.  She refills her vape pen twice per week. She has noticed increasing dyspnea since vaping compared to using cigarettes.  She reports having chest tubes and septic pulmonary emboli when she was hospitalized for endocarditis.   She owns a tattoo shop. No second hand smoke exposure currently. No hazardous dust or chemical exposures through previous work.   She was followed by [facility] previously and PFTs from 04/22/2017 are below.   FVC was 2.84 liters,  82% of predicted/Post 2.72, 79%, -4% Change FEV1 was 2.22, 75% of predicted/Post 2.21, 74%, -0% Change FEV1 ratio was 78/Post 81 FEF 25-75% liters per second was 55% of predicted/Post 64%, 17% Change *SVN given with 2.5 mg Albuterol for Post Spirometry  Chest radiograph from 05/27/21 is without acute abnormalities.   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   Bioprosthetic   Shortness of breath 07/28/2021   Vaping nicotine dependence, tobacco product 07/28/2021     Family History  Problem Relation Age of Onset   Diabetes Father    Skin cancer Maternal Grandmother      Social History   Socioeconomic History   Marital status: Single    Spouse name: Not on file   Number of children: Not on file   Years of education: Not on file   Highest education level: Not on file  Occupational History   Not on file  Tobacco Use   Smoking status: Some Days    Packs/day: 0.50    Types: Cigarettes   Smokeless tobacco: Never   Tobacco comments:    Vapor    Vaping more than smoking.  Rarely smokes.  Tried salt nic. Went 2 weeks with out smoking.  Went down in level of vaping.  10/28/21 hfb  Substance and Sexual Activity  Alcohol use: No   Drug use: Not Currently   Sexual activity: Yes    Birth control/protection: None, I.U.D.  Other Topics Concern   Not on file  Social History Narrative   ** Merged History Encounter **       Social Determinants of Health   Financial Resource Strain: Not on file  Food Insecurity: Not on file  Transportation Needs: Not on file  Physical Activity: Not on file  Stress: Not on file  Social Connections: Not on file  Intimate Partner Violence: Not on file     Allergies  Allergen Reactions   Kiwi Extract Swelling   Pineapple Swelling   Pregabalin Hives    Sulfa Antibiotics Hives   Symbicort [Budesonide-Formoterol Fumarate] Itching    Throat itching    Tramadol Hives   Trelegy Ellipta [Fluticasone-Umeclidin-Vilant] Itching    Throat itching      Outpatient Medications Prior to Visit  Medication Sig Dispense Refill   albuterol (VENTOLIN HFA) 108 (90 Base) MCG/ACT inhaler Inhale 2 puffs into the lungs every 6 (six) hours as needed for wheezing or shortness of breath. 8 g 6   levonorgestrel (MIRENA, 52 MG,) 20 MCG/24HR IUD Mirena 20 mcg/24 hours (6 yrs) 52 mg intrauterine device  Take 1 device by intrauterine route.     budesonide-formoterol (SYMBICORT) 80-4.5 MCG/ACT inhaler Inhale 2 puffs into the lungs 2 (two) times daily. 1 each 5   No facility-administered medications prior to visit.    Review of Systems  Constitutional:  Negative for chills, fever, malaise/fatigue and weight loss.  HENT:  Negative for congestion, sinus pain and sore throat.   Eyes: Negative.   Respiratory:  Positive for shortness of breath. Negative for cough, hemoptysis, sputum production and wheezing.        Burning sensation of lungs  Cardiovascular:  Negative for chest pain, palpitations, orthopnea, claudication and leg swelling.  Gastrointestinal:  Negative for abdominal pain, heartburn, nausea and vomiting.  Genitourinary: Negative.   Musculoskeletal:  Negative for joint pain and myalgias.  Skin:  Negative for rash.  Neurological:  Negative for weakness.  Endo/Heme/Allergies: Negative.   Psychiatric/Behavioral: Negative.     Objective:   Vitals:   12/22/21 0916  BP: 118/80  Pulse: 77  SpO2: 97%  Weight: 183 lb (83 kg)  Height: 5\' 4"  (1.626 m)     Physical Exam Constitutional:      General: She is not in acute distress.    Appearance: She is not ill-appearing.  HENT:     Head: Normocephalic and atraumatic.  Eyes:     General: No scleral icterus.    Conjunctiva/sclera: Conjunctivae normal.  Cardiovascular:     Rate and Rhythm: Normal  rate and regular rhythm.     Pulses: Normal pulses.     Heart sounds: Normal heart sounds. No murmur heard. Pulmonary:     Effort: Pulmonary effort is normal.     Breath sounds: Normal breath sounds. No wheezing, rhonchi or rales.  Musculoskeletal:     Right lower leg: No edema.     Left lower leg: No edema.  Skin:    General: Skin is warm and dry.  Neurological:     General: No focal deficit present.     Mental Status: She is alert.  Psychiatric:        Mood and Affect: Mood normal.        Behavior: Behavior normal.        Thought Content: Thought content normal.  Judgment: Judgment normal.   CBC    Component Value Date/Time   WBC 7.9 10/28/2021 1057   RBC 4.69 10/28/2021 1057   HGB 14.3 10/28/2021 1057   HCT 43.8 10/28/2021 1057   PLT 178.0 10/28/2021 1057   MCV 93.3 10/28/2021 1057   MCH 30.6 05/06/2021 2221   MCHC 32.7 10/28/2021 1057   RDW 13.6 10/28/2021 1057   LYMPHSABS 1.7 10/28/2021 1057   MONOABS 0.6 10/28/2021 1057   EOSABS 0.1 10/28/2021 1057   BASOSABS 0.0 10/28/2021 1057   BMP Latest Ref Rng & Units 10/28/2021 05/06/2021 07/04/2018  Glucose 70 - 99 mg/dL 88 71 87  BUN 6 - 23 mg/dL 17 16 15   Creatinine 0.40 - 1.20 mg/dL 1.10 3.15  Sodium 135 - 145 mEq/L 138 137 138  Potassium 3.5 - 5.1 mEq/L 4.1 3.8 4.0  Chloride 96 - 112 mEq/L 105 102 101  CO2 19 - 32 mEq/L 26 25 26   Calcium 8.4 - 10.5 mg/dL 9.4 9.9 9.4   Chest imaging: CXR 10/28/21 Trachea is. Heart size. Mild bihilar prominence, similar. Lungs are clear. No pleural fluid. Surgical clips in right axillary region.  CXR 05/06/21 Post median sternotomy. Heart is upper normal in size, stable. Unchanged mediastinal contours. Occasional areas of scarring and interstitial coarsening, unchanged. No confluent airspace disease, pleural effusion, or pneumothorax. No pulmonary edema. Right axillary surgical clips.  PFT: PFT Results Latest Ref Rng & Units 10/28/2021  FVC-Pre L 3.14  FVC-Predicted  Pre % 83  FVC-Post L 3.01  FVC-Predicted Post % 80  Pre FEV1/FVC % % 78  Post FEV1/FCV % % 85  FEV1-Pre L 2.44  FEV1-Predicted Pre % 77  FEV1-Post L 2.56  DLCO uncorrected ml/min/mmHg 18.33  DLCO UNC% % 82  DLCO corrected ml/min/mmHg 18.33  DLCO COR %Predicted % 82  DLVA Predicted % 102  TLC L 4.62  TLC % Predicted % 91  RV % Predicted % 112  PFTs 10/2021 within normal limits  Labs:  Path:  Echo 08/08/21: LV EF 60-65%. RV systolic function is mildly reduced. Tricuspid Valve replaced with mosaic 12mm mitral valve.  Heart Catheterization:  Assessment & Plan:   Vaping nicotine dependence, tobacco product  Mild intermittent reactive airway disease without complication  Discussion: Kelly Mora is a 36 year old woman, daily vapor with history of endocarditis s/p bioprosthetic valve who returns to pulmonary clinic for shortness of breath.   She likely has reactive airways disease in setting of on going vape use. Recommended vaping cessation with nicotine replacement as this worked for her for 2 weeks. We discussed adding wellbutrin therapy to nicotine replacement therapy which she will think about and call Kelly Mora if she would like to start this in the future.   We discussed trying nebulizer treatments since she complains of throat irritation with MDI and DPI inhalers. She would like to hold off on that at that this time.  She is to follow up as needed.  20, MD [facility] Office: [phone]   Current Outpatient Medications:    albuterol (VENTOLIN HFA) 108 (90 Base) MCG/ACT inhaler, Inhale 2 puffs into the lungs every 6 (six) hours as needed for wheezing or shortness of breath., Disp: 8 g, Rfl: 6   levonorgestrel (MIRENA, 52 MG,) 20 MCG/24HR IUD, Mirena 20 mcg/24 hours (6 yrs) 52 mg intrauterine device  Take 1 device by intrauterine route., Disp: , Rfl:

## 2021-12-22 NOTE — Patient Instructions (Addendum)
Recommend using nicotine patches, starting with the 21mg  patch daily followed by 14mg  and then 7mg . You can taper the dose as you feel ready.   If you would like to start wellbutrin therapy please call in and request the medication.   Follow up as needed, call if symptoms remain.

## 2022-01-10 ENCOUNTER — Telehealth: Payer: Self-pay | Admitting: Pulmonary Disease

## 2022-01-10 DIAGNOSIS — F17290 Nicotine dependence, other tobacco product, uncomplicated: Secondary | ICD-10-CM

## 2022-01-11 MED ORDER — BUPROPION HCL ER (SR) 150 MG PO TB12: 150.0000 mg | ORAL_TABLET | Freq: Two times a day (BID) | ORAL | 3 refills | Status: AC

## 2022-01-11 NOTE — Telephone Encounter (Signed)
Prescription sent in. ? ?She is to take wellbutrin 150mg  SR tablet daily for 7 days then increase to 1 tablet twice daily.  ? ?She can be on this medication as long as she needs to be, we can determine this based on how she is doing with smoking cessation.  ? ?Please schedule follow up visit in early May. ? ?Thanks, ?JD ?

## 2022-01-11 NOTE — Telephone Encounter (Signed)
ATC, per DPR left detailed message letting patient know that Dr. Francine Graven has sent in a prescription for Wellbutrin to help her quit smoking. Gave her directions on how to take it and advised her to call back with any questions and also to call back to get scheduled for an OV in May with Dr. Francine Graven.  Nothing further needed at this time.  ?

## 2022-01-11 NOTE — Telephone Encounter (Signed)
Attempted to call pt but unable to reach. Left message for her to return call. 

## 2022-01-11 NOTE — Telephone Encounter (Signed)
I called and she is willing to start the Wellbutrin for quitting smoking. ? ?She wants to know how long she will be on this medication and when she can come off the medication. Please advise.  ?

## 2022-02-28 ENCOUNTER — Telehealth (HOSPITAL_BASED_OUTPATIENT_CLINIC_OR_DEPARTMENT_OTHER): Payer: Self-pay

## 2022-02-28 NOTE — Telephone Encounter (Signed)
Attempted to call patient, no answer, will respond to mychart message  ?

## 2022-03-02 ENCOUNTER — Encounter: Payer: Self-pay | Admitting: Nurse Practitioner

## 2022-03-02 ENCOUNTER — Ambulatory Visit (INDEPENDENT_AMBULATORY_CARE_PROVIDER_SITE_OTHER): Payer: Medicare Other | Admitting: Nurse Practitioner

## 2022-03-02 ENCOUNTER — Ambulatory Visit (INDEPENDENT_AMBULATORY_CARE_PROVIDER_SITE_OTHER): Payer: Medicare Other

## 2022-03-02 VITALS — BP 122/86 | HR 87 | Temp 97.9°F | Ht 64.0 in | Wt 192.6 lb

## 2022-03-02 DIAGNOSIS — R058 Other specified cough: Secondary | ICD-10-CM | POA: Diagnosis not present

## 2022-03-02 DIAGNOSIS — J4520 Mild intermittent asthma, uncomplicated: Secondary | ICD-10-CM | POA: Diagnosis not present

## 2022-03-02 DIAGNOSIS — R0602 Shortness of breath: Secondary | ICD-10-CM

## 2022-03-02 DIAGNOSIS — R600 Localized edema: Secondary | ICD-10-CM

## 2022-03-02 DIAGNOSIS — J4531 Mild persistent asthma with (acute) exacerbation: Secondary | ICD-10-CM

## 2022-03-02 LAB — CBC WITH DIFFERENTIAL/PLATELET
Basophils Absolute: 0 10*3/uL (ref 0.0–0.1)
Basophils Relative: 0.2 % (ref 0.0–3.0)
Eosinophils Absolute: 0.1 10*3/uL (ref 0.0–0.7)
Eosinophils Relative: 0.4 % (ref 0.0–5.0)
HCT: 44.6 % (ref 36.0–46.0)
Hemoglobin: 14.8 g/dL (ref 12.0–15.0)
Lymphocytes Relative: 9.5 % — ABNORMAL LOW (ref 12.0–46.0)
Lymphs Abs: 1.5 10*3/uL (ref 0.7–4.0)
MCHC: 33.2 g/dL (ref 30.0–36.0)
MCV: 91.9 fl (ref 78.0–100.0)
Monocytes Absolute: 0.7 10*3/uL (ref 0.1–1.0)
Monocytes Relative: 4.7 % (ref 3.0–12.0)
Neutro Abs: 13.2 10*3/uL — ABNORMAL HIGH (ref 1.4–7.7)
Neutrophils Relative %: 85.2 % — ABNORMAL HIGH (ref 43.0–77.0)
Platelets: 239 10*3/uL (ref 150.0–400.0)
RBC: 4.85 Mil/uL (ref 3.87–5.11)
RDW: 13 % (ref 11.5–15.5)
WBC: 15.5 10*3/uL — ABNORMAL HIGH (ref 4.0–10.5)

## 2022-03-02 LAB — BASIC METABOLIC PANEL
BUN: 23 mg/dL (ref 6–23)
CO2: 29 mEq/L (ref 19–32)
Calcium: 9.5 mg/dL (ref 8.4–10.5)
Chloride: 103 mEq/L (ref 96–112)
Creatinine, Ser: 0.93 mg/dL (ref 0.40–1.20)
GFR: 80.37 mL/min (ref >60.00)
Glucose, Bld: 123 mg/dL — ABNORMAL HIGH (ref 70–99)
Potassium: 4.5 mEq/L (ref 3.5–5.1)
Sodium: 139 mEq/L (ref 135–145)

## 2022-03-02 LAB — POCT EXHALED NITRIC OXIDE: FeNO level (ppb): 5

## 2022-03-02 LAB — D-DIMER, QUANTITATIVE: D-Dimer, Quant: 0.28 mcg/mL FEU (ref <0.50)

## 2022-03-02 MED ORDER — PROMETHAZINE-CODEINE 6.25-10 MG/5ML PO SYRP: 5.0000 mL | ORAL_SOLUTION | Freq: Four times a day (QID) | ORAL | 0 refills | Status: DC | PRN

## 2022-03-02 MED ORDER — PREDNISONE 10 MG PO TABS: ORAL_TABLET | ORAL | 0 refills | Status: DC

## 2022-03-02 MED ORDER — BENZONATATE 200 MG PO CAPS: 200.0000 mg | ORAL_CAPSULE | Freq: Three times a day (TID) | ORAL | 1 refills | Status: AC | PRN

## 2022-03-02 NOTE — Assessment & Plan Note (Addendum)
Possible slow to resolve exacerbation. FeNO nl at 5 ppb; did recently complete prednisone. I do suspect there is a component of upper airway irritation to her symptoms so we will target cough control. CXR today to rule out acute process. CBC with diff to assess eosinophils. Pt would prefer to hold off on neb meds at this point. ? ?Patient Instructions  ?Continue albuterol inhaler 2 puffs or 3 mL neb every 6 hours as needed for shortness of breath or wheezing. Notify if symptoms persist despite rescue inhaler/neb use. ?Continue Xyzal 5 mg daily for allergies ?Continue flonase 2 sprays each nostril daily ?Continue famotidine 20 mg daily for reflux ? ?-Promethazine with codeine 5 mL every 6 hours as needed for cough ?-Tessalon perles 1 capsule every 8 hours as needed for cough ?-Prednisone taper. 4 tabs for 2 days, then 3 tabs for 2 days, 2 tabs for 2 days, then 1 tab for 2 days, then stop. Take in AM with food. ? ?Avoid throat clearing. Frequent sips of water. Suck on sugar free hard candies throughout the day ? ?Chest x ray today. We will notify you of any abnormal results ? ?Follow up in 2 weeks with Dr. Francine Graven or Philis Nettle. If symptoms do not improve or worsen, please contact office for sooner follow up or seek emergency care. ? ? ?

## 2022-03-02 NOTE — Assessment & Plan Note (Signed)
Given her pleuritic pain, persistent cough, and increased DOE, concern for possible PE. Will check d dimer - obtain CTA if positive.  ?

## 2022-03-02 NOTE — Patient Instructions (Addendum)
Continue albuterol inhaler 2 puffs or 3 mL neb every 6 hours as needed for shortness of breath or wheezing. Notify if symptoms persist despite rescue inhaler/neb use. ?Continue Xyzal 5 mg daily for allergies ?Continue flonase 2 sprays each nostril daily ?Continue famotidine 20 mg daily for reflux ? ?-Promethazine with codeine 5 mL every 6 hours as needed for cough ?-Tessalon perles 1 capsule every 8 hours as needed for cough ?-Prednisone taper. 4 tabs for 2 days, then 3 tabs for 2 days, 2 tabs for 2 days, then 1 tab for 2 days, then stop. Take in AM with food. ? ?Avoid throat clearing. Frequent sips of water. Suck on sugar free hard candies throughout the day ? ?Chest x ray today. We will notify you of any abnormal results ? ?Follow up in 2 weeks with Dr. Francine Graven or Philis Nettle. If symptoms do not improve or worsen, please contact office for sooner follow up or seek emergency care. ?

## 2022-03-02 NOTE — Assessment & Plan Note (Signed)
Target cough control. Unable to use syrups with codeine. Will continue robitussin. Advised she add on chlortab at night and sent rx for tessalon perles.  ?

## 2022-03-02 NOTE — Progress Notes (Signed)
? ?@Patient  ID: Kelly Mora, female    DOB: [birth year], 36 y.o.   MRN: [id number] ? ?Chief Complaint  ?Patient presents with  ? Follow-up  ?  Wt. Gain.  Feeling worse.  On Prednisone since Friday.  Has gained 10 pounds since 4/17.   Cough is worse at night and with activity.  Dry, feels like she is suffocating.  ? ? ?Referring provider: ?Trey SailorsPa, [facility]* ? ?HPI: ?36 year old female, daily vaper followed for mild persistent asthma.  She is a patient Dr. Lanora Manisewald's and last seen in office on 12/22/2021.  Past medical history significant for endocarditis status post bioprosthetic valve, hypertension, bipolar disorder, history of narcotic abuse. ? ?TEST/EVENTS:  ?04/24/2017 PFTs: FVC 82%, FEV1 75%, ratio 81.  No significant bronchodilator response however did have some mid flow reversibility. ?08/08/2021 echocardiogram: EF 60 to 65%.  RV systolic function is mildly reduced.  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 ?10/28/2021 PFTs: FVC 83%, FEV1 81%, ratio 85, TLC 91, DLCO corrected for alveolar volume 82%.  No significant bronchodilator response. ? ?12/22/2021: OV with Dr. Francine Gravenewald.  Patient was previously placed on trial of Symbicort 80.  Has not tolerated Trelegy or Spiriva in the past.  Reported that she was unable to tolerate Symbicort at this visit due to throat irritation and runny nose symptoms.  She does continue to vape but has decreased her nicotine amount per cartridge. Prescribed Wellbutrin to help with vaping cessation.  ? ?03/02/2022: Today - acute visit ?Patient presents today for persistent cough which started around 3 weeks ago. She describes it as dry and barky, worse at night. She was coughing up some pink tinged sputum at one point but this has resolved. She has increased shortness of breath and noticed an occasional wheezing sound. She also has had some associated chest tightness/discomfort on her right side. Her PCP put her on doxycycline, which she reports caused increased  shortness of breath, and switched her to augmentin. She also was put on prednisone 20 mg for 10 days and finished yesterday. She does feel like she is feeling a little bit better today, but not back to her baseline. She also notes that she's had some weight gain (9 lb) and swelling in her ankles over the past 10 days. She denies any palpitations, mass hemoptysis, calf pain, anorexia, fevers. She has been unable to tolerate inhalers in the past so is not currently on anything. She does take Xyzal and pepcid over the counter. She does continue to vape but is down to 20 puffs a day from 250 puffs. She takes her Wellbutrin but forgets it some days.  ? ?Allergies  ?Allergen Reactions  ? Doxycycline Hyclate Shortness Of Breath  ? Kiwi Extract Swelling  ? Pineapple Swelling  ? Pregabalin Hives  ? Sulfa Antibiotics Hives  ? Symbicort [Budesonide-Formoterol Fumarate] Itching  ?  Throat itching   ? Tramadol Hives  ? Trelegy Ellipta [Fluticasone-Umeclidin-Vilant] Itching  ?  Throat itching   ? ? ?Immunization History  ?Administered Date(s) Administered  ? Tdap 01/23/2018  ? ? ?Past Medical History:  ?Diagnosis Date  ? Bipolar disorder (HCC) 03/14/2018  ? Endocarditis   ? drug use  ? Essential hypertension 03/14/2018  ? 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 Neuropathy   ? 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 Bioprosthetic  ? Shortness of breath 07/28/2021  ? Vaping nicotine dependence, tobacco product 07/28/2021  ? ? ?Tobacco History: ?Social History  ? ?Tobacco Use  ?Smoking  Status Some Days  ? Packs/day: 0.50  ? Types: Cigarettes  ?Smokeless Tobacco Never  ?Tobacco Comments  ? Vapor  ? Vaping more than smoking.  Rarely smokes.  Tried salt nic. Went 2 weeks with out smoking.  Went down in level of vaping.  Decreased vaping, 250 puffs/day to 20 puffs/day.  03/02/2022  hfb  ? ?Ready to quit: Not Answered ?Counseling given: Not Answered ?Tobacco comments: Vapor ?Vaping more than smoking.  Rarely smokes.  Tried salt nic.  Went 2 weeks with out smoking.  Went down in level of vaping.  Decreased vaping, 250 puffs/day to 20 puffs/day.  03/02/2022  hfb ? ? ?Outpatient Medications Prior to Visit  ?Medication Sig Dispense Refill  ? albuterol (VENTOLIN HFA) 108 (90 Base) MCG/ACT inhaler Inhale 2 puffs into the lungs every 6 (six) hours as needed for wheezing or shortness of breath. 8 g 6  ? buPROPion (WELLBUTRIN SR) 150 MG 12 hr tablet Take 1 tablet (150 mg total) by mouth 2 (two) times daily. Take 1 tablet daily for the first 7 days, then increase to 1 tablet twice daily as prescribed 30 tablet 3  ? levonorgestrel (MIRENA, 52 MG,) 20 MCG/24HR IUD Mirena 20 mcg/24 hours (6 yrs) 52 mg intrauterine device ? Take 1 device by intrauterine route.    ? ?No facility-administered medications prior to visit.  ? ? ? ?Review of Systems:  ? ?Constitutional: +9 lb weight gain in 10 days. No night sweats, fevers, chills, fatigue, or lassitude. ?HEENT: No headaches, difficulty swallowing, tooth/dental problems, or sore throat. No sneezing, itching, ear ache, nasal congestion, or post nasal drip ?CV:  +swelling in lower extremities. No chest pain, orthopnea, PND, anasarca, dizziness, palpitations, syncope ?Resp: +shortness of breath with exertion; chest tightness with deep breaths; dry, barky cough; occasional wheeze. No excess mucus or change in color of mucus. No hemoptysis. No wheezing.  No chest wall deformity ?GI:  No heartburn, indigestion, abdominal pain, nausea, vomiting, diarrhea, change in bowel habits, loss of appetite, bloody stools.  ?Skin: No rash, lesions, ulcerations ?MSK:  No joint pain or swelling.  No decreased range of motion.  No back pain. ?Neuro: No dizziness or lightheadedness.  ?Psych: No depression or anxiety. Mood stable.  ? ? ? ?Physical Exam: ? ?BP 122/86 (BP Location: Right Arm, Patient Position: Sitting, Cuff Size: Normal)   Pulse 87   Temp 97.9 ?F (36.6 ?C) (Oral)   Ht 5\' 4"  (1.626 m)   Wt 192 lb 9.6 oz (87.4 kg)   SpO2  97%   BMI 33.06 kg/m?  ? ?GEN: Pleasant, interactive, well-appearing; obese; in no acute distress. ?HEENT:  Normocephalic and atraumatic. PERRLA. Sclera white. Nasal turbinates pink, moist and patent bilaterally. No rhinorrhea present. Oropharynx erythematous and moist, without exudate or edema. No lesions, ulcerations ?NECK:  Supple w/ fair ROM. No JVD present. Normal carotid impulses w/o bruits. Thyroid symmetrical with no goiter or nodules palpated. No lymphadenopathy.   ?CV: RRR, no m/r/g, no peripheral edema. Pulses intact, +2 bilaterally. No cyanosis, pallor or clubbing. ?PULMONARY:  Unlabored, regular breathing. Clear bilaterally A&P w/o wheezes/rales/rhonchi. No accessory muscle use. No dullness to percussion. ?GI: BS present and normoactive. Soft, non-tender to palpation. No organomegaly or masses detected. No CVA tenderness. ?MSK: No erythema, warmth or tenderness. Cap refil <2 sec all extrem. No deformities or joint swelling noted.  ?Neuro: A/Ox3. No focal deficits noted.   ?Skin: Warm, no lesions or rashe ?Psych: Normal affect and behavior. Judgement and thought content appropriate.  ? ? ? ?  Lab Results: ? ?CBC ?   ?Component Value Date/Time  ? WBC 15.5 (H) 03/02/2022 1520  ? RBC 4.85 03/02/2022 1520  ? HGB 14.8 03/02/2022 1520  ? HCT 44.6 03/02/2022 1520  ? PLT 239.0 03/02/2022 1520  ? MCV 91.9 03/02/2022 1520  ? MCH 30.6 05/06/2021 2221  ? MCHC 33.2 03/02/2022 1520  ? RDW 13.0 03/02/2022 1520  ? LYMPHSABS 1.5 03/02/2022 1520  ? MONOABS 0.7 03/02/2022 1520  ? EOSABS 0.1 03/02/2022 1520  ? BASOSABS 0.0 03/02/2022 1520  ? ? ?BMET ?   ?Component Value Date/Time  ? NA 139 03/02/2022 1520  ? K 4.5 03/02/2022 1520  ? CL 103 03/02/2022 1520  ? CO2 29 03/02/2022 1520  ? GLUCOSE 123 (H) 03/02/2022 1520  ? BUN 23 03/02/2022 1520  ? CREATININE 0.93 03/02/2022 1520  ? CALCIUM 9.5 03/02/2022 1520  ? GFRNONAA >60 05/06/2021 2221  ? GFRAA >60 07/04/2018 2149  ? ? ?BNP ?   ?Component Value Date/Time  ? BNP 52.4  03/27/2018 1959  ? ? ? ?Imaging: ? ?DG Chest 2 View ? ?Result Date: 03/02/2022 ?CLINICAL DATA:  36 year old female with persistent cough. EXAM: CHEST - 2 VIEW COMPARISON:  October 28, 2021. FINDINGS: Trachea midline. Ca

## 2022-03-02 NOTE — Assessment & Plan Note (Signed)
No edema on exam. Report of 9 lb weight gain since being on prednisone and some swelling in her ankles. Advised that it is likely from prednisone use and should improve once she is off of this taper. Advised her to notify cardiology if it worsens or does not improve. No evidence of pulmonary edema on CXR.  ?

## 2022-03-03 NOTE — Progress Notes (Signed)
Please notify patient that her d dimer was negative, which was the lab we drew to rule out a blood clot. Her CBC showed that her white blood cell count was high, which is likely due to the prednisone she has been on. I recommend rechecking in 4 weeks after completing prednisone. Chest x ray was clear. Thanks!

## 2022-04-12 ENCOUNTER — Ambulatory Visit (INDEPENDENT_AMBULATORY_CARE_PROVIDER_SITE_OTHER): Payer: Medicare Other | Admitting: Nurse Practitioner

## 2022-04-12 ENCOUNTER — Encounter: Payer: Self-pay | Admitting: Nurse Practitioner

## 2022-04-12 VITALS — BP 118/72 | HR 78 | Temp 97.8°F | Ht 64.5 in | Wt 192.2 lb

## 2022-04-12 DIAGNOSIS — J4530 Mild persistent asthma, uncomplicated: Secondary | ICD-10-CM | POA: Diagnosis not present

## 2022-04-12 DIAGNOSIS — J4520 Mild intermittent asthma, uncomplicated: Secondary | ICD-10-CM | POA: Diagnosis not present

## 2022-04-12 DIAGNOSIS — R058 Other specified cough: Secondary | ICD-10-CM

## 2022-04-12 DIAGNOSIS — R053 Chronic cough: Secondary | ICD-10-CM | POA: Diagnosis not present

## 2022-04-12 LAB — POCT EXHALED NITRIC OXIDE: FeNO level (ppb): 11

## 2022-04-12 MED ORDER — MONTELUKAST SODIUM 10 MG PO TABS: 10.0000 mg | ORAL_TABLET | Freq: Every day | ORAL | 5 refills | Status: AC

## 2022-04-12 MED ORDER — PROMETHAZINE-DM 6.25-15 MG/5ML PO SYRP: 5.0000 mL | ORAL_SOLUTION | Freq: Four times a day (QID) | ORAL | 0 refills | Status: AC | PRN

## 2022-04-12 MED ORDER — PANTOPRAZOLE SODIUM 40 MG PO TBEC: 40.0000 mg | DELAYED_RELEASE_TABLET | Freq: Every day | ORAL | 3 refills | Status: AC

## 2022-04-12 NOTE — Assessment & Plan Note (Addendum)
Given her minimal response to empiric therapies, we will obtain HRCT for further evaluation. Her PFTs from December were overall normal. FVC was borderline. May be neurogenic in nature - could consider trial of gabapentin if workup unrevealing or no improvement.

## 2022-04-12 NOTE — Patient Instructions (Addendum)
Continue albuterol inhaler 2 puffs or 3 mL neb every 6 hours as needed for shortness of breath or wheezing. Notify if symptoms persist despite rescue inhaler/neb use. Continue Xyzal 5 mg daily for allergies Continue flonase 2 sprays each nostril daily Continue famotidine 20 mg daily for reflux   -Promethazine with dextromethorphan 5 mL every 6 hours as needed for cough -Tessalon perles 1 capsule every 8 hours for cough -Chlorpheniramine 4 mg tab At bedtime for cough -Protonix 40 mg daily - take 30 min prior to breakfast  -Singulair 10 mg At bedtime. Monitor for any mood changes.    Avoid throat clearing. Frequent sips of water. Suck on sugar free hard candies throughout the day   CT chest ordered for further evaluation   Follow up after CT chest with Dr. Erin Fulling. If symptoms do not improve or worsen, please contact office for sooner follow up or seek emergency care.

## 2022-04-12 NOTE — Assessment & Plan Note (Signed)
Possible upper airway irritation. We discussed the importance of cough control measures. Will trial addition of chlortab and phenergan DM syrup. Recommended she use tessalon Three times a day regardless of coughing. Add PPI for possible silent GERD.

## 2022-04-12 NOTE — Progress Notes (Signed)
  ID: Kelly Mora, female    DOB: Oct 17, 1986, 36 y.o.   MRN: [id number]  Chief Complaint  Patient presents with   Follow-up    Chronic cough     Referring provider: [facility]  HPI: 36 year old female, daily vaper followed for mild persistent asthma.  She is a patient Dr. Lanora Manis and last seen in office on 12/22/2021.  Past medical history significant for endocarditis status post bioprosthetic valve, hypertension, bipolar disorder, history of narcotic abuse.  TEST/EVENTS:  04/24/2017 PFTs: FVC 82%, FEV1 75%, ratio 81.  No significant bronchodilator response however did have some mid flow reversibility. 08/08/2021 echocardiogram: EF 60 to 65%.  RV systolic function is mildly reduced.  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 10/28/2021 PFTs: FVC 83%, FEV1 81%, ratio 85, TLC 91, DLCO corrected for alveolar volume 82%.  No significant bronchodilator response.  12/22/2021: OV with Dr. Francine Graven.  Patient was previously placed on trial of Symbicort 80.  Has not tolerated Trelegy or Spiriva in the past.  Reported that she was unable to tolerate Symbicort at this visit due to throat irritation and runny nose symptoms.  She does continue to vape but has decreased her nicotine amount per cartridge. Prescribed Wellbutrin to help with vaping cessation.   03/02/2022: OV with Grayce Budden NP for persistent cough which started around 3 weeks ago. She describes it as dry and barky, worse at night. She was coughing up some pink tinged sputum at one point but this has resolved. She has increased shortness of breath and noticed an occasional wheezing sound. She also has had some associated chest tightness/discomfort on her right side. Her PCP put her on doxycycline, which she reports caused increased shortness of breath, and switched her to augmentin. She also was put on prednisone 20 mg for 10 days and finished yesterday. She does feel like she is feeling a little bit better today, but  not back to her baseline. She also notes that she's had some weight gain (9 lb) and swelling in her ankles over the past 10 days. She denies any palpitations, mass hemoptysis, calf pain, anorexia, fevers. She has been unable to tolerate inhalers in the past so is not currently on anything. She does take Xyzal and pepcid over the counter. She does continue to vape but is down to 20 puffs a day from 250 puffs. She takes her Wellbutrin but forgets it some days.   04/12/2022: Today - follow up Patient presents today for follow up. She continues to have a persistent cough which is usually dry. She will occasionally produce some sputum which is clear to white. Her DOE is overall stable and she didn't notice much of a difference with the prednisone. She has used Symbicort previously without much relief and it also caused throat irritation. Previous FeNO was nl. She denies any hemoptysis, weight loss, allergy symptoms, reflux, wheezing, leg swelling. Her weight and bloating have stabilized since coming off the prednisone. She continues on Xyzal, floanse and pepcid. She was taking the tessalon perles intermittently with mild relief and is using robitussin. She never started on chlortab. Doesn't notice much of a difference with albuterol use.  FeNO 11 ppb  Allergies  Allergen Reactions   Doxycycline Hyclate Shortness Of Breath   Kiwi Extract Swelling   Pineapple Swelling   Pregabalin Hives   Sulfa Antibiotics Hives   Symbicort [Budesonide-Formoterol Fumarate] Itching    Throat itching    Tramadol Hives   Trelegy Ellipta [Fluticasone-Umeclidin-Vilant] Itching  Throat itching     Immunization History  Administered Date(s) Administered   Influenza,inj,Quad PF,6+ Mos 09/12/2017   Influenza-Unspecified 09/12/2017   Tdap 01/23/2018    Past Medical History:  Diagnosis Date   Bipolar disorder (HCC) 03/14/2018   Endocarditis    drug use   Essential hypertension 03/14/2018   Narcotic abuse in remission  (HCC) 03/14/2018   Neuropathy    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   Bioprosthetic   Shortness of breath 07/28/2021   Vaping nicotine dependence, tobacco product 07/28/2021    Tobacco History: Social History   Tobacco Use  Smoking Status Some Days   Packs/day: 0.50   Types: Cigarettes  Smokeless Tobacco Never  Tobacco Comments   Vaping 25 hits a day 04/12/22 [facility]    Ready to quit: Not Answered Counseling given: Not Answered Tobacco comments: Vaping 25 hits a day 04/12/22 [facility]    Outpatient Medications Prior to Visit  Medication Sig Dispense Refill   albuterol (VENTOLIN HFA) 108 (90 Base) MCG/ACT inhaler Inhale 2 puffs into the lungs every 6 (six) hours as needed for wheezing or shortness of breath. 8 g 6   benzonatate (TESSALON) 200 MG capsule Take 1 capsule (200 mg total) by mouth 3 (three) times daily as needed for cough. 30 capsule 1   buPROPion (WELLBUTRIN SR) 150 MG 12 hr tablet Take 1 tablet (150 mg total) by mouth 2 (two) times daily. Take 1 tablet daily for the first 7 days, then increase to 1 tablet twice daily as prescribed 30 tablet 3   levonorgestrel (MIRENA, 52 MG,) 20 MCG/24HR IUD Mirena 20 mcg/24 hours (6 yrs) 52 mg intrauterine device  Take 1 device by intrauterine route.     predniSONE (DELTASONE) 10 MG tablet 4 tabs for 2 days, then 3 tabs for 2 days, 2 tabs for 2 days, then 1 tab for 2 days, then stop 20 tablet 0   No facility-administered medications prior to visit.     Review of Systems:   Constitutional: No weight gain or loss, night sweats, fevers, chills, fatigue, or lassitude. HEENT: No headaches, difficulty swallowing, tooth/dental problems, or sore throat. No sneezing, itching, ear ache, nasal congestion, or post nasal drip CV:  No chest pain, orthopnea, PND, swelling in lower extremities, anasarca, dizziness, palpitations, syncope Resp: +shortness of breath with exertion (unchanged); chronic cough (unchanged). No excess mucus or change in color of  mucus. No hemoptysis. No wheezing.  No chest wall deformity GI:  No heartburn, indigestion, abdominal pain, nausea, vomiting, diarrhea, change in bowel habits, loss of appetite, bloody stools.  Skin: No rash, lesions, ulcerations MSK:  No joint pain or swelling.  No decreased range of motion.  No back pain. Neuro: No dizziness or lightheadedness.  Psych: No depression or anxiety. Mood stable.     Physical Exam:  BP 118/72 (BP Location: Right Arm, Patient Position: Sitting, Cuff Size: Normal)   Pulse 78   Temp 97.8 F (36.6 C) (Oral)   Ht 5' 4.5" (1.638 m)   Wt 192 lb 3.2 oz (87.2 kg)   SpO2 98%   BMI 32.48 kg/m   GEN: Pleasant, interactive, well-appearing; obese; in no acute distress. HEENT:  Normocephalic and atraumatic. PERRLA. Sclera white. Nasal turbinates pink, moist and patent bilaterally. No rhinorrhea present. Oropharynx erythematous and moist, without exudate or edema. No lesions, ulcerations NECK:  Supple w/ fair ROM. No JVD present. Normal carotid impulses w/o bruits. Thyroid symmetrical with no goiter or nodules palpated. No lymphadenopathy.   CV:  RRR, no m/r/g, no peripheral edema. Pulses intact, +2 bilaterally. No cyanosis, pallor or clubbing. PULMONARY:  Unlabored, regular breathing. Clear bilaterally A&P w/o wheezes/rales/rhonchi. No accessory muscle use. No dullness to percussion. GI: BS present and normoactive. Soft, non-tender to palpation. No organomegaly or masses detected. No CVA tenderness. MSK: No erythema, warmth or tenderness. Cap refil <2 sec all extrem. No deformities or joint swelling noted.  Neuro: A/Ox3. No focal deficits noted.   Skin: Warm, no lesions or rashe Psych: Normal affect and behavior. Judgement and thought content appropriate.     Lab Results:  CBC    Component Value Date/Time   WBC 15.5 (H) 03/02/2022 1520   RBC 4.85 03/02/2022 1520   HGB 14.8 03/02/2022 1520   HCT 44.6 03/02/2022 1520   PLT 239.0 03/02/2022 1520   MCV 91.9  03/02/2022 1520   MCH 30.6 05/06/2021 2221   MCHC 33.2 03/02/2022 1520   RDW 13.0 03/02/2022 1520   LYMPHSABS 1.5 03/02/2022 1520   MONOABS 0.7 03/02/2022 1520   EOSABS 0.1 03/02/2022 1520   BASOSABS 0.0 03/02/2022 1520    BMET    Component Value Date/Time   NA 139 03/02/2022 1520   K 4.5 03/02/2022 1520   CL 103 03/02/2022 1520   CO2 29 03/02/2022 1520   GLUCOSE 123 (H) 03/02/2022 1520   BUN 23 03/02/2022 1520   CREATININE 0.93 03/02/2022 1520   CALCIUM 9.5 03/02/2022 1520   GFRNONAA >60 05/06/2021 2221   GFRAA >60 07/04/2018 2149    BNP    Component Value Date/Time   BNP 52.4 03/27/2018 1959     Imaging:  No results found.       Latest Ref Rng & Units 10/28/2021    9:00 AM  PFT Results  FVC-Pre L 3.14    FVC-Predicted Pre % 83    FVC-Post L 3.01    FVC-Predicted Post % 80    Pre FEV1/FVC % % 78    Post FEV1/FCV % % 85    FEV1-Pre L 2.44    FEV1-Predicted Pre % 77    FEV1-Post L 2.56    DLCO uncorrected ml/min/mmHg 18.33    DLCO UNC% % 82    DLCO corrected ml/min/mmHg 18.33    DLCO COR %Predicted % 82    DLVA Predicted % 102    TLC L 4.62    TLC % Predicted % 91    RV % Predicted % 112      No results found for: NITRICOXIDE      Assessment & Plan:   Asthma Not convinced this is our driving factor. PFTs without BD from December. She is minimally responsive to steroids and no change with Symbicort in the past. FeNO remains nl today despite persistent cough and being off prednisone for a month. No evidence of bronchospasm on exam. Advised we could trial addition of singulair given minimal elevation in eosinophils and inability to tolerate ICS.   Patient Instructions  Continue albuterol inhaler 2 puffs or 3 mL neb every 6 hours as needed for shortness of breath or wheezing. Notify if symptoms persist despite rescue inhaler/neb use. Continue Xyzal 5 mg daily for allergies Continue flonase 2 sprays each nostril daily Continue famotidine 20 mg  daily for reflux   -Promethazine with dextromethorphan 5 mL every 6 hours as needed for cough -Tessalon perles 1 capsule every 8 hours for cough -Chlorpheniramine 4 mg tab At bedtime for cough -Protonix 40 mg daily - take 30 min prior to  breakfast  -Singulair 10 mg At bedtime. Monitor for any mood changes.    Avoid throat clearing. Frequent sips of water. Suck on sugar free hard candies throughout the day   CT chest ordered for further evaluation   Follow up after CT chest with Dr. Francine Graven. If symptoms do not improve or worsen, please contact office for sooner follow up or seek emergency care.    Upper airway cough syndrome Possible upper airway irritation. We discussed the importance of cough control measures. Will trial addition of chlortab and phenergan DM syrup. Recommended she use tessalon Three times a day regardless of coughing. Add PPI for possible silent GERD.   Chronic cough Given her minimal response to empiric therapies, we will obtain HRCT for further evaluation. Her PFTs from December were overall normal. FVC was borderline. May be neurogenic in nature - could consider trial of gabapentin if workup unrevealing or no improvement.    I spent 35 minutes of dedicated to the care of this patient on the date of this encounter to include pre-visit review of records, face-to-face time with the patient discussing conditions above, post visit ordering of testing, clinical documentation with the electronic health record, making appropriate referrals as documented, and communicating necessary findings to members of the patients care team.  Noemi Chapel, NP 04/12/2022  Pt aware and understands NP's role.

## 2022-04-12 NOTE — Assessment & Plan Note (Addendum)
Not convinced this is our driving factor. PFTs without BD from December. She is minimally responsive to steroids and no change with Symbicort in the past. FeNO remains nl today despite persistent cough and being off prednisone for a month. No evidence of bronchospasm on exam. Advised we could trial addition of singulair given minimal elevation in eosinophils and inability to tolerate ICS.   Patient Instructions  Continue albuterol inhaler 2 puffs or 3 mL neb every 6 hours as needed for shortness of breath or wheezing. Notify if symptoms persist despite rescue inhaler/neb use. Continue Xyzal 5 mg daily for allergies Continue flonase 2 sprays each nostril daily Continue famotidine 20 mg daily for reflux   -Promethazine with dextromethorphan 5 mL every 6 hours as needed for cough -Tessalon perles 1 capsule every 8 hours for cough -Chlorpheniramine 4 mg tab At bedtime for cough -Protonix 40 mg daily - take 30 min prior to breakfast  -Singulair 10 mg At bedtime. Monitor for any mood changes.    Avoid throat clearing. Frequent sips of water. Suck on sugar free hard candies throughout the day   CT chest ordered for further evaluation   Follow up after CT chest with Dr. Francine Graven. If symptoms do not improve or worsen, please contact office for sooner follow up or seek emergency care.

## 2022-04-18 ENCOUNTER — Ambulatory Visit (HOSPITAL_BASED_OUTPATIENT_CLINIC_OR_DEPARTMENT_OTHER)
Admission: RE | Admit: 2022-04-18 | Discharge: 2022-04-18 | Disposition: A | Discharge status: Home / Self Care | Payer: Medicare Other | Source: Ambulatory Visit | Attending: Nurse Practitioner | Admitting: Nurse Practitioner

## 2022-04-18 ENCOUNTER — Other Ambulatory Visit (HOSPITAL_BASED_OUTPATIENT_CLINIC_OR_DEPARTMENT_OTHER): Payer: Medicare Other

## 2022-04-18 DIAGNOSIS — R053 Chronic cough: Secondary | ICD-10-CM | POA: Diagnosis present

## 2022-04-19 NOTE — Progress Notes (Signed)
Please schedule patient a follow up with Dr. Francine Graven to discuss CT scan results. She had some nonspecific changes in her lung bases, which were relatively unchanged when compared to her scan from 2019. She also had some small prevalent lymph nodes. He can review this in more detail and discuss next steps given her persistent cough. Thanks!

## 2022-04-23 NOTE — Progress Notes (Unsigned)
Office Visit    Patient Name: Kelly Mora Date of Encounter: 04/24/2022  PCP:  [facility]   [facility]  Cardiologist:  Chilton Si, MD  Advanced Practice Provider:  No care team member to display Electrophysiologist:  None      Chief Complaint    Kelly Mora is a 36 y.o. 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   Shortness of breath 07/28/2021   Vaping nicotine dependence, tobacco product 07/28/2021   Past Surgical History:  Procedure Laterality Date   CARDIAC SURGERY     CESAREAN SECTION N/A 03/21/2018   Procedure: PRIMARY CESAREAN SECTION;  Surgeon: Lavina Hamman, MD;  Location: [facility];  Service: Obstetrics;  Laterality: N/A;  Request RNFA   TRICUSPID VALVE REPLACEMENT     x 3; last in 2016: Mitral Mosaic 59mm - [id number]    Allergies  Allergies  Allergen Reactions   Doxycycline Hyclate Shortness Of Breath   Kiwi Extract Swelling   Pineapple Swelling   Pregabalin Hives   Sulfa Antibiotics Hives   Symbicort [Budesonide-Formoterol Fumarate] Itching    Throat itching    Tramadol Hives   Trelegy Ellipta [Fluticasone-Umeclidin-Vilant] Itching    Throat itching     History of Present Illness    Kelly Mora is a 36 y.o. female with a hx of endocarditis s/p tricuspid valve replacement x3, HTN, PE, substance abuse history, bipolar disorder, aortic atherosclerosis last seen 07/28/21.  Prior patient of Dr. Juliann Pares. Echo 09/217 EF 50%, mild MR, mild TR, mild PR. History of substance abuse with endocarditis and TV replacement twice in 2014 and once in 2016. Developed PE after second surgery.   Echo 03/2018 tricuspid  valve stable. Myoview 02/2020 due to chest pain with EF 56% no ischemia. Last seen 07/28/21 with dyspnea in setting of vaping. Updated echo performed 08/2021 EF 60-65%, no RWMA, normal LV strain, RVSF mildly reduced, tricuspid valve with mean gradient 8 mmHg, trivial AI.   She presents today for follow up. Works as a Insurance underwriter.  She lives at home with her fianc and 31-year-old son.  Chief complaint today is ongoing cough since Easter.  She has been worked up by pulmonology.  Does note with coughing spells she feels lightheaded and we discussed likely etiology vasovagal.  She notes after coughing spells her chest will feel tight and we discussed musculoskeletal etiology.  She notes shortness of breath at rest and with exertion.  She had lower extremity edema a few weeks ago but has since resolved.  We reviewed CT from April 26, 2022 with fairly nonspecific appearance of scattered linear densities and groundglass in the mid and lower lung zones, aortic atherosclerosis, enlarged pulmonary trunk indicative of pulmonary hypertension.  EKGs/Labs/Other Studies Reviewed:   The following studies were reviewed today:  CT 2022-04-26   IMPRESSION: 1. No definitive evidence of fibrotic interstitial lung disease. 2. Fairly nonspecific appearance of scattered linear densities and ground-glass in the mid and lower lung zones. Possible fissural nodularity. Together with numerous small mediastinal lymph nodes, the possibility of nontypical sarcoid cannot be excluded. 3.  Aortic atherosclerosis (ICD10-I70.0). 4. Enlarged pulmonic trunk, indicative of pulmonary arterial hypertension.  Echo 08/2021  1. Left ventricular ejection fraction, by estimation, is 60 to 65%. Left  ventricular ejection fraction by 3D volume is 59 %. The left ventricle has  normal function. The left ventricle has no regional wall motion  abnormalities. Left ventricular diastolic   parameters are indeterminate. The average left ventricular  global  longitudinal strain is -22.0 %. The global longitudinal strain is normal.   2. Right ventricular systolic function is mildly reduced. The right  ventricular size is normal. TAPSE and S' analysis excluded in the setting  of tricuspid prosthesis.   3. The tricuspid valve is has been repaired/replaced with a Mosaic 25 mm  mitral valve in the tricuspid position. Mean valve gradient 8 mm Hg at  heart rate of 73 bpm. PHT exceed 190 ms.   4. The mitral valve is normal in structure. No evidence of mitral valve  regurgitation. No evidence of mitral stenosis.   5. The aortic valve is tricuspid. Aortic valve regurgitation is trivial.  No aortic stenosis is present.   Lexiscan Myoview 02/26/20: The left ventricular ejection fraction is normal (55-65%). Nuclear stress EF: 56%. There was no ST segment deviation noted during stress. The study is normal. This is a low risk study.   Normal stress nuclear study with no ischemia or infarction; gated EF 56 with normal wall motion.    Echo 03/20/18: Study Conclusions   - Left ventricle: The cavity size was normal. Wall thickness was    normal. Systolic function was normal. The estimated ejection    fraction was in the range of 60% to 65%.  - Mitral valve: There was mild regurgitation.  - Tricuspid valve: A bioprosthesis was present.     EKG:  EKG is ordered today.  The ekg ordered today demonstrates sinus rhythm 86 bpm, incomplete right bundle branch block.  QTc 481.  Recent Labs: 05/07/2021: ALT 23 03/02/2022: BUN 23; Creatinine, Ser 0.93; Hemoglobin 14.8; Platelets 239.0; Potassium 4.5; Sodium 139  Recent Lipid Panel No results found for: "CHOL", "TRIG", "HDL", "CHOLHDL", "VLDL", "LDLCALC", "LDLDIRECT"   Home Medications   Current Meds  Medication Sig   albuterol (VENTOLIN HFA) 108 (90 Base) MCG/ACT inhaler Inhale 2 puffs into the lungs every 6 (six) hours as needed for wheezing or shortness of breath.   benzonatate (TESSALON) 200 MG  capsule Take 1 capsule (200 mg total) by mouth 3 (three) times daily as needed for cough.   buPROPion (WELLBUTRIN SR) 150 MG 12 hr tablet Take 1 tablet (150 mg total) by mouth 2 (two) times daily. Take 1 tablet daily for the first 7 days, then increase to 1 tablet twice daily as prescribed   furosemide (LASIX) 20 MG tablet Take 1 tablet (20 mg total) by mouth daily.   levonorgestrel (MIRENA, 52 MG,) 20 MCG/24HR IUD Mirena 20 mcg/24 hours (6 yrs) 52 mg intrauterine device  Take 1 device by intrauterine route.   montelukast (SINGULAIR) 10 MG tablet Take 1 tablet (10 mg total) by mouth at bedtime.   pantoprazole (PROTONIX) 40 MG tablet Take 1 tablet (40 mg total) by mouth daily.   promethazine-dextromethorphan (PROMETHAZINE-DM) 6.25-15 MG/5ML syrup Take 5 mLs by mouth 4 (four) times daily as needed for cough.     Review of Systems      All other systems reviewed and are otherwise negative except as noted above.  Physical Exam    VS:  BP 114/70 (BP Location: Left Arm, Patient Position: Sitting, Cuff Size: Normal)   Pulse 86  Ht 5' 4.5" (1.638 m)   Wt 194 lb (88 kg)   BMI 32.79 kg/m  , BMI Body mass index is 32.79 kg/m.  Wt Readings from Last 3 Encounters:  04/24/22 194 lb (88 kg)  04/12/22 192 lb 3.2 oz (87.2 kg)  03/02/22 192 lb 9.6 oz (87.4 kg)     GEN: Well nourished, well developed, in no acute distress. HEENT: normal. Neck: Supple, no JVD, carotid bruits, or masses. Cardiac: RRR, no murmurs, rubs, or gallops. No clubbing, cyanosis, edema.  Radials/PT 2+ and equal bilaterally.  Respiratory:  Respirations regular and unlabored, clear to auscultation bilaterally. GI: Soft, nontender, nondistended. MS: No deformity or atrophy. Skin: Warm and dry, no rash. Neuro:  Strength and sensation are intact. Psych: Normal affect.  Assessment & Plan    HTN -BP well controlled.  Not requiring antihypertensive agent.  Bipolar diorder - was able to wean medications.  Follows with  PCP.  S/p TVR x3 /pulmonary hypertension-repeat echo upcoming October 2023.  Continue SBE prophylaxis.  CT 04/2019 revealed large pulmonary trunk indicative of pulm arterial hypertension.  She notes shortness of breath.  We will start Lasix 20 mg daily.  BMP, magnesium in 1 week for monitoring.  Aortic atherosclerosis -noted by CT 04/2022.  No anginal symptoms.  Discussed secondary prevention via diet and exercise.  04/2022 LDL 124, total cholesterol 182, HDL 44, triglycerides 78.  Will initiate referral to [facility] for increased exercise and lipid-lowering diet.  Repeat cholesterol panel in 6 months.  If LDL not less than initiate statin at that time.  Cough -notes lightheadedness with coughing episodes.  Work-up being completed by pulmonology team.  We discussed that lightheadedness is triggering a vagal nerve and encouraged her to sit down during coughing spells.  Some back and chest discomfort after coughing spells which is musculoskeletal-encouraged to trial NSAID PRN.  Tobacco use - Smoking cessation encouraged. Recommend utilization of 1800QUITNOW.   BMI 32 -interested in being increasingly active.  Referral to [facility] placed.  Disposition: Follow up in 4 month(s) with Skeet Latch, MD or APP.  Signed, Loel Dubonnet, NP 04/24/2022, 9:30 AM [place]

## 2022-04-24 ENCOUNTER — Encounter (HOSPITAL_BASED_OUTPATIENT_CLINIC_OR_DEPARTMENT_OTHER): Payer: Self-pay | Admitting: Family

## 2022-04-24 ENCOUNTER — Ambulatory Visit (INDEPENDENT_AMBULATORY_CARE_PROVIDER_SITE_OTHER): Payer: Medicare Other | Admitting: Family

## 2022-04-24 VITALS — BP 114/70 | HR 86 | Ht 64.5 in | Wt 194.0 lb

## 2022-04-24 DIAGNOSIS — Z954 Presence of other heart-valve replacement: Secondary | ICD-10-CM | POA: Diagnosis not present

## 2022-04-24 DIAGNOSIS — I272 Pulmonary hypertension, unspecified: Secondary | ICD-10-CM | POA: Diagnosis not present

## 2022-04-24 DIAGNOSIS — F319 Bipolar disorder, unspecified: Secondary | ICD-10-CM | POA: Diagnosis not present

## 2022-04-24 DIAGNOSIS — I10 Essential (primary) hypertension: Secondary | ICD-10-CM

## 2022-04-24 DIAGNOSIS — Z720 Tobacco use: Secondary | ICD-10-CM | POA: Diagnosis not present

## 2022-04-24 MED ORDER — FUROSEMIDE 20 MG PO TABS: 20.0000 mg | ORAL_TABLET | Freq: Every day | ORAL | 2 refills | Status: AC

## 2022-04-24 NOTE — Patient Instructions (Addendum)
Medication Instructions:   START Furosemide 20 mg daily.  *If you need a refill on your cardiac medications before your next appointment, please call your pharmacy*   Lab Work: Your physician recommends that you return for lab work in 1 week--BMET, Magnesium  Your physician recommends that you return for a FASTING lipid profile and CMET in 6 months (December).   If you have labs (blood work) drawn today and your tests are completely normal, you will receive your results only by: MyChart Message (if you have MyChart) OR A paper copy in the mail If you have any lab test that is abnormal or we need to change your treatment, we will call you to review the results.   Testing/Procedures:  Please schedule your echocardiogram to be done in October.   Follow-Up: At [facility], you and your health needs are our priority.  As part of our continuing mission to provide you with exceptional heart care, we have created designated Provider Care Teams.  These Care Teams include your primary Cardiologist (physician) and Advanced Practice Providers (APPs -  Physician Assistants and Nurse Practitioners) who all work together to provide you with the care you need, when you need it.  We recommend signing up for the patient portal called "MyChart".  Sign up information is provided on this After Visit Summary.  MyChart is used to connect with patients for Virtual Visits (Telemedicine).  Patients are able to view lab/test results, encounter notes, upcoming appointments, etc.  Non-urgent messages can be sent to your provider as well.   To learn more about what you can do with MyChart, go to [email or web address].    Your next appointment:   After echocardiogram   The format for your next appointment:   In Person  Provider:   Dr. Chilton Si or Gillian Shields, NP     Other Instructions  You have been referred to [facility].   Important Information About  [place]

## 2022-04-24 NOTE — Addendum Note (Signed)
Addended by: Ileene Musa D on: 04/24/2022 10:59 AM   Modules accepted: Orders

## 2022-04-26 ENCOUNTER — Encounter (HOSPITAL_BASED_OUTPATIENT_CLINIC_OR_DEPARTMENT_OTHER): Payer: Self-pay

## 2022-04-29 LAB — BASIC METABOLIC PANEL
BUN/Creatinine Ratio: 17 (ref 9–23)
BUN: 14 mg/dL (ref 6–20)
CO2: 25 mmol/L (ref 20–29)
Calcium: 9.4 mg/dL (ref 8.7–10.2)
Chloride: 103 mmol/L (ref 96–106)
Creatinine, Ser: 0.81 mg/dL (ref 0.57–1.00)
Glucose: 84 mg/dL (ref 70–99)
Potassium: 4.4 mmol/L (ref 3.5–5.2)
Sodium: 139 mmol/L (ref 134–144)
eGFR: 98 mL/min/{1.73_m2} (ref >59)

## 2022-04-29 LAB — MAGNESIUM: Magnesium: 2.1 mg/dL (ref 1.6–2.3)

## 2022-05-04 ENCOUNTER — Other Ambulatory Visit
Admission: RE | Admit: 2022-05-04 | Discharge: 2022-05-04 | Disposition: A | Discharge status: Home / Self Care | Payer: Medicare Other | Source: Ambulatory Visit | Attending: Specialist | Admitting: Specialist

## 2022-05-04 DIAGNOSIS — R9389 Abnormal findings on diagnostic imaging of other specified body structures: Secondary | ICD-10-CM | POA: Insufficient documentation

## 2022-05-04 DIAGNOSIS — R053 Chronic cough: Secondary | ICD-10-CM | POA: Insufficient documentation

## 2022-05-04 DIAGNOSIS — I288 Other diseases of pulmonary vessels: Secondary | ICD-10-CM | POA: Diagnosis present

## 2022-05-04 LAB — BRAIN NATRIURETIC PEPTIDE: B Natriuretic Peptide: 25.6 pg/mL (ref 0.0–100.0)

## 2022-06-08 ENCOUNTER — Ambulatory Visit: Payer: Medicare Other | Admitting: Pulmonary Disease

## 2022-07-19 ENCOUNTER — Other Ambulatory Visit (HOSPITAL_BASED_OUTPATIENT_CLINIC_OR_DEPARTMENT_OTHER): Payer: Self-pay | Admitting: Family

## 2022-08-08 ENCOUNTER — Telehealth (HOSPITAL_BASED_OUTPATIENT_CLINIC_OR_DEPARTMENT_OTHER): Payer: Self-pay | Admitting: Cardiovascular Disease

## 2022-08-08 NOTE — Telephone Encounter (Signed)
Called to speak with patient and schedule the Echocardiogram ordered by Dr. Oval Linsey for October 2023.  Patient states she has changed cardiologist and has had the testing completed.

## 2022-08-14 ENCOUNTER — Other Ambulatory Visit (HOSPITAL_BASED_OUTPATIENT_CLINIC_OR_DEPARTMENT_OTHER): Payer: Medicare Other

## 2022-08-21 ENCOUNTER — Other Ambulatory Visit (HOSPITAL_BASED_OUTPATIENT_CLINIC_OR_DEPARTMENT_OTHER): Payer: Medicare Other

## 2022-08-28 ENCOUNTER — Ambulatory Visit (HOSPITAL_BASED_OUTPATIENT_CLINIC_OR_DEPARTMENT_OTHER): Payer: Medicare Other | Admitting: Cardiovascular Disease

## 2022-11-13 ENCOUNTER — Encounter (HOSPITAL_BASED_OUTPATIENT_CLINIC_OR_DEPARTMENT_OTHER): Payer: Self-pay

## 2022-11-13 ENCOUNTER — Other Ambulatory Visit: Payer: Self-pay

## 2022-11-13 ENCOUNTER — Emergency Department (HOSPITAL_BASED_OUTPATIENT_CLINIC_OR_DEPARTMENT_OTHER): Payer: Medicare Other

## 2022-11-13 ENCOUNTER — Emergency Department (HOSPITAL_BASED_OUTPATIENT_CLINIC_OR_DEPARTMENT_OTHER)
Admission: EM | Admit: 2022-11-13 | Discharge: 2022-11-13 | Disposition: A | Discharge status: Home / Self Care | Payer: Medicare Other | Attending: Emergency Medicine | Admitting: Emergency Medicine

## 2022-11-13 DIAGNOSIS — N200 Calculus of kidney: Secondary | ICD-10-CM

## 2022-11-13 DIAGNOSIS — I10 Essential (primary) hypertension: Secondary | ICD-10-CM | POA: Insufficient documentation

## 2022-11-13 DIAGNOSIS — R1032 Left lower quadrant pain: Secondary | ICD-10-CM | POA: Diagnosis present

## 2022-11-13 LAB — BASIC METABOLIC PANEL
Anion gap: 11 (ref 5–15)
BUN: 25 mg/dL — ABNORMAL HIGH (ref 6–20)
CO2: 25 mmol/L (ref 22–32)
Calcium: 10.1 mg/dL (ref 8.9–10.3)
Chloride: 102 mmol/L (ref 98–111)
Creatinine, Ser: 0.87 mg/dL (ref 0.44–1.00)
GFR, Estimated: >60 mL/min (ref >60)
Glucose, Bld: 116 mg/dL — ABNORMAL HIGH (ref 70–99)
Potassium: 3.9 mmol/L (ref 3.5–5.1)
Sodium: 138 mmol/L (ref 135–145)

## 2022-11-13 LAB — CBC WITH DIFFERENTIAL/PLATELET
Abs Immature Granulocytes: 0.05 10*3/uL (ref 0.00–0.07)
Basophils Absolute: 0.1 10*3/uL (ref 0.0–0.1)
Basophils Relative: 0 %
Eosinophils Absolute: 0.1 10*3/uL (ref 0.0–0.5)
Eosinophils Relative: 1 %
HCT: 43.2 % (ref 36.0–46.0)
Hemoglobin: 14.9 g/dL (ref 12.0–15.0)
Immature Granulocytes: 0 %
Lymphocytes Relative: 12 %
Lymphs Abs: 1.6 10*3/uL (ref 0.7–4.0)
MCH: 30.5 pg (ref 26.0–34.0)
MCHC: 34.5 g/dL (ref 30.0–36.0)
MCV: 88.5 fL (ref 80.0–100.0)
Monocytes Absolute: 0.8 10*3/uL (ref 0.1–1.0)
Monocytes Relative: 6 %
Neutro Abs: 10.8 10*3/uL — ABNORMAL HIGH (ref 1.7–7.7)
Neutrophils Relative %: 81 %
Platelets: 220 10*3/uL (ref 150–400)
RBC: 4.88 MIL/uL (ref 3.87–5.11)
RDW: 12.1 % (ref 11.5–15.5)
WBC: 13.3 10*3/uL — ABNORMAL HIGH (ref 4.0–10.5)
nRBC: 0 % (ref 0.0–0.2)

## 2022-11-13 LAB — URINALYSIS, ROUTINE W REFLEX MICROSCOPIC
Bilirubin Urine: NEGATIVE
Glucose, UA: NEGATIVE mg/dL
Ketones, ur: NEGATIVE mg/dL
Leukocytes,Ua: NEGATIVE
Nitrite: NEGATIVE
RBC / HPF: >50 RBC/hpf — ABNORMAL HIGH (ref 0–5)
Specific Gravity, Urine: 1.02 (ref 1.005–1.030)
pH: 7 (ref 5.0–8.0)

## 2022-11-13 LAB — PREGNANCY, URINE: Preg Test, Ur: NEGATIVE

## 2022-11-13 MED ORDER — SODIUM CHLORIDE 0.9 % IV BOLUS
1000.0000 mL | Freq: Once | INTRAVENOUS | Status: AC
  Administered 2022-11-13: 1000 mL via INTRAVENOUS

## 2022-11-13 MED ORDER — OXYCODONE-ACETAMINOPHEN 5-325 MG PO TABS: 1.0000 | ORAL_TABLET | Freq: Four times a day (QID) | ORAL | 0 refills | Status: AC | PRN

## 2022-11-13 MED ORDER — ONDANSETRON HCL 4 MG/2ML IJ SOLN
4.0000 mg | Freq: Once | INTRAMUSCULAR | Status: AC
  Administered 2022-11-13: 4 mg via INTRAVENOUS
  Filled 2022-11-13: qty 2

## 2022-11-13 MED ORDER — KETOROLAC TROMETHAMINE 30 MG/ML IJ SOLN
30.0000 mg | Freq: Once | INTRAMUSCULAR | Status: AC
  Administered 2022-11-13: 30 mg via INTRAVENOUS
  Filled 2022-11-13: qty 1

## 2022-11-13 MED ORDER — OXYCODONE-ACETAMINOPHEN 5-325 MG PO TABS
1.0000 | ORAL_TABLET | Freq: Once | ORAL | Status: AC
  Administered 2022-11-13: 1 via ORAL
  Filled 2022-11-13: qty 1

## 2022-11-13 MED ORDER — ONDANSETRON 4 MG PO TBDP: 4.0000 mg | ORAL_TABLET | Freq: Three times a day (TID) | ORAL | 0 refills | Status: AC | PRN

## 2022-11-13 MED ORDER — TAMSULOSIN HCL 0.4 MG PO CAPS: 0.4000 mg | ORAL_CAPSULE | Freq: Every day | ORAL | 0 refills | Status: AC

## 2022-11-13 MED ORDER — MORPHINE SULFATE (PF) 4 MG/ML IV SOLN
4.0000 mg | Freq: Once | INTRAVENOUS | Status: AC
  Administered 2022-11-13: 4 mg via INTRAVENOUS
  Filled 2022-11-13: qty 1

## 2022-11-13 NOTE — ED Provider Notes (Signed)
[facility] EMERGENCY DEPT Provider Note   CSN: [id number] Arrival date & time: 11/13/22  0041     History  Chief Complaint  Patient presents with   Flank Pain    Kelly Mora is a 37 y.o. female.  HPI     This is a 37 year old female who presents with acute onset of left lower quadrant pain.  Patient states that she has pain in her left lower quadrant that radiates into her back.  She has had nausea without vomiting.  Denies dysuria or hematuria.  No history of kidney stones.  She did not take anything for her pain.  Has not noted any change in bowels.  Home Medications Prior to Admission medications   Medication Sig Start Date End Date Taking? Authorizing Provider  ondansetron (ZOFRAN-ODT) 4 MG disintegrating tablet Take 1 tablet (4 mg total) by mouth every 8 (eight) hours as needed for nausea or vomiting. 11/13/22  Yes Hilda Rynders, Mayer Masker, MD  oxyCODONE-acetaminophen (PERCOCET/ROXICET) 5-325 MG tablet Take 1 tablet by mouth every 6 (six) hours as needed for severe pain. 11/13/22  Yes Yeray Tomas, Mayer Masker, MD  tamsulosin (FLOMAX) 0.4 MG CAPS capsule Take 1 capsule (0.4 mg total) by mouth daily. 11/13/22  Yes Jeffory Snelgrove, Mayer Masker, MD  albuterol (VENTOLIN HFA) 108 (90 Base) MCG/ACT inhaler Inhale 2 puffs into the lungs every 6 (six) hours as needed for wheezing or shortness of breath. 08/25/21   Martina Sinner, MD  benzonatate (TESSALON) 200 MG capsule Take 1 capsule (200 mg total) by mouth 3 (three) times daily as needed for cough. 03/02/22   Cobb, Ruby Cola, NP  buPROPion (WELLBUTRIN SR) 150 MG 12 hr tablet Take 1 tablet (150 mg total) by mouth 2 (two) times daily. Take 1 tablet daily for the first 7 days, then increase to 1 tablet twice daily as prescribed 01/11/22   Martina Sinner, MD  furosemide (LASIX) 20 MG tablet Take 1 tablet (20 mg total) by mouth daily. 04/24/22 07/23/22  Alver Sorrow, NP  levonorgestrel (MIRENA, 52 MG,) 20 MCG/24HR IUD Mirena 20 mcg/24 hours  (6 yrs) 52 mg intrauterine device  Take 1 device by intrauterine route.    [provider]  montelukast (SINGULAIR) 10 MG tablet Take 1 tablet (10 mg total) by mouth at bedtime. 04/12/22   Cobb, Ruby Cola, NP  pantoprazole (PROTONIX) 40 MG tablet Take 1 tablet (40 mg total) by mouth daily. 04/12/22   Cobb, Ruby Cola, NP  promethazine-dextromethorphan (PROMETHAZINE-DM) 6.25-15 MG/5ML syrup Take 5 mLs by mouth 4 (four) times daily as needed for cough. 04/12/22   Cobb, Ruby Cola, NP      Allergies    Doxycycline hyclate, Kiwi extract, Pineapple, Pregabalin, Sulfa antibiotics, Symbicort [budesonide-formoterol fumarate], Tramadol, and Trelegy ellipta [fluticasone-umeclidin-vilant]    Review of Systems   Review of Systems  Constitutional:  Negative for fever.  Genitourinary:  Negative for dysuria and hematuria.  All other systems reviewed and are negative.   Physical Exam Updated Vital Signs BP 106/77   Pulse (!) 58   Temp 97.8 F (36.6 C) (Oral)   Resp 18   SpO2 94%  Physical Exam Vitals and nursing note reviewed.  Constitutional:      Appearance: She is well-developed. She is not ill-appearing.  HENT:     Head: Normocephalic and atraumatic.  Eyes:     Pupils: Pupils are equal, round, and reactive to light.  Cardiovascular:     Rate and Rhythm: Normal rate and regular rhythm.  Heart sounds: Normal heart sounds.  Pulmonary:     Effort: Pulmonary effort is normal. No respiratory distress.     Breath sounds: No wheezing.  Abdominal:     General: Bowel sounds are normal.     Palpations: Abdomen is soft.     Tenderness: There is no abdominal tenderness. There is left CVA tenderness. There is no guarding or rebound.  Musculoskeletal:     Cervical back: Neck supple.  Skin:    General: Skin is warm and dry.  Neurological:     Mental Status: She is alert and oriented to person, place, and time.  Psychiatric:        Mood and Affect: Mood normal.     ED Results /  Procedures / Treatments   Labs (all labs ordered are listed, but only abnormal results are displayed) Labs Reviewed  URINALYSIS, ROUTINE W REFLEX MICROSCOPIC - Abnormal; Notable for the following components:      Result Value   Hgb urine dipstick LARGE (*)    Protein, ur TRACE (*)    RBC / HPF >50 (*)    Bacteria, UA RARE (*)    All other components within normal limits  CBC WITH DIFFERENTIAL/PLATELET - Abnormal; Notable for the following components:   WBC 13.3 (*)    Neutro Abs 10.8 (*)    All other components within normal limits  BASIC METABOLIC PANEL - Abnormal; Notable for the following components:   Glucose, Bld 116 (*)    BUN 25 (*)    All other components within normal limits  PREGNANCY, URINE    EKG None  Radiology CT Renal Stone Study  Result Date: 11/13/2022 CLINICAL DATA:  Flank pain/lower back pain with nausea. Stone suspected. EXAM: CT ABDOMEN AND PELVIS WITHOUT CONTRAST TECHNIQUE: Multidetector CT imaging of the abdomen and pelvis was performed following the standard protocol without IV contrast. RADIATION DOSE REDUCTION: This exam was performed according to the departmental dose-optimization program which includes automated exposure control, adjustment of the mA and/or kV according to patient size and/or use of iterative reconstruction technique. COMPARISON:  None Available. FINDINGS: Lower chest: Mild atelectasis or scarring at the lung bases. Hepatobiliary: No focal liver abnormality is seen. No gallstones, gallbladder wall thickening, or biliary dilatation. Pancreas: Unremarkable. No pancreatic ductal dilatation or surrounding inflammatory changes. Spleen: Normal in size without focal abnormality. Adrenals/Urinary Tract: The adrenal glands are within normal limits. A punctate renal calculus is noted on the right. There is a 2 mm calculus at the ureteropelvic junction on the left with mild hydronephrosis. No obstructive uropathy on the right. The bladder is unremarkable.  Stomach/Bowel: Stomach is within normal limits. Appendix appears normal. No evidence of bowel wall thickening, distention, or inflammatory changes. No free air or pneumatosis. Vascular/Lymphatic: No significant vascular findings are present. No enlarged abdominal or pelvic lymph nodes. Reproductive: Uterus and bilateral adnexa are unremarkable. Other: No abdominopelvic ascites. Small fat containing umbilical hernia. Musculoskeletal: Sclerosis is present at the sacroiliac joints bilaterally, compatible with sacroiliitis. No acute osseous abnormality. Sternotomy wires are present over the midline. IMPRESSION: 1. 2 mm calculus at the ureteropelvic junction on the left with mild hydronephrosis. 2. Nonobstructive right renal calculus. Electronically Signed   By: Thornell Sartorius M.D.   On: 11/13/2022 04:55    Procedures Procedures    Medications Ordered in ED Medications  oxyCODONE-acetaminophen (PERCOCET/ROXICET) 5-325 MG per tablet 1 tablet (has no administration in time range)  sodium chloride 0.9 % bolus 1,000 mL (1,000 mLs Intravenous New  Bag/Given 11/13/22 0344)  morphine (PF) 4 MG/ML injection 4 mg (4 mg Intravenous Given 11/13/22 0351)  ondansetron (ZOFRAN) injection 4 mg (4 mg Intravenous Given 11/13/22 0344)  ketorolac (TORADOL) 30 MG/ML injection 30 mg (30 mg Intravenous Given 11/13/22 1027)    ED Course/ Medical Decision Making/ A&P                           Medical Decision Making Amount and/or Complexity of Data Reviewed Labs: ordered. Radiology: ordered.  Risk Prescription drug management.   This patient presents to the ED for concern of abdominal and flank pain, this involves an extensive number of treatment options, and is a complaint that carries with it a high risk of complications and morbidity.  I considered the following differential and admission for this acute, potentially life threatening condition.  The differential diagnosis includes UTI, kidney stone, diverticulitis, ovarian  pathology  MDM:    This is a 37 year old female who presents with left lower quadrant left flank pain.  She is nontoxic and vital signs are reassuring.  History is highly suspicious for kidney stone.  She was given pain and nausea medication as well as fluids.  Labs obtained and reviewed and largely reassuring.  No evidence of UTI or metabolic derangement.  CT stone study shows a 2 mm stone at the UVJ.  On recheck, patient is fairly well-controlled.  She states that she can still feel some discomfort but it is improved.  No ongoing nausea or vomiting.  We discussed supportive measures and pain control at home.  Urology follow-up provided.  (Labs, imaging, consults)  Labs: I Ordered, and personally interpreted labs.  The pertinent results include: CBC, BMP, urinalysis  Imaging Studies ordered: I ordered imaging studies including CT stone study I independently visualized and interpreted imaging. I agree with the radiologist interpretation  Additional history obtained from chart review.  External records from outside source obtained and reviewed including prior evaluations  Cardiac Monitoring: The patient was maintained on a cardiac monitor.  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    Dispense:  10 tablet    Refill:  0   ondansetron (ZOFRAN-ODT) 4 MG disintegrating tablet    Sig: Take 1 tablet (4 mg total) by mouth every 8 (eight) hours as needed for nausea or vomiting.    Dispense:  20 tablet    Refill:  0   tamsulosin (FLOMAX) 0.4 MG  CAPS capsule    Sig: Take 1 capsule (0.4 mg total) by mouth daily.    Dispense:  15 capsule    Refill:  0   oxyCODONE-acetaminophen (PERCOCET/ROXICET) 5-325 MG per tablet 1 tablet    I have reviewed the patients home medicines and have made adjustments as needed  Problem List / ED Course: Problem List Items Addressed This Visit   None Visit Diagnoses     Kidney stone    -  Primary   Relevant Medications   morphine (PF) 4 MG/ML injection 4 mg (Completed)   oxyCODONE-acetaminophen (PERCOCET/ROXICET) 5-325 MG tablet   oxyCODONE-acetaminophen (PERCOCET/ROXICET) 5-325 MG per tablet 1 tablet (Start on 11/13/2022  5:45 AM)                   Final Clinical Impression(s) / ED Diagnoses Final diagnoses:  Kidney stone    Rx / DC Orders ED Discharge Orders          Ordered    oxyCODONE-acetaminophen (PERCOCET/ROXICET) 5-325 MG tablet  Every 6 hours PRN        11/13/22 0528    ondansetron (ZOFRAN-ODT) 4 MG disintegrating tablet  Every 8 hours PRN        11/13/22 0528    tamsulosin (FLOMAX) 0.4 MG CAPS capsule  Daily        11/13/22 0528              Shon Baton, MD 11/13/22 [id number]

## 2022-11-13 NOTE — Discharge Instructions (Signed)
Make sure that you are staying hydrated.  Take pain and nausea medication as prescribed.  If you have recurrence of pain, vomiting, fevers, any new or worsening symptoms, you should follow-up closely with urology or return to the emergency department.

## 2022-11-13 NOTE — ED Triage Notes (Signed)
Pt arrives to ED POV c/o flank pain/ lower back pain and nausea x1hr. Marland Kitchen

## 2022-12-29 IMAGING — CT CT CHEST HIGH RESOLUTION
2 of 7 series · 15 of 36 positions shown, 18 images · non-contrast
Comparison: 07/05/2018.

CLINICAL DATA: Cough.



[Series 7: high res thins · axial · 0.67mm/px · z∈[+1196,+1438]mm · 12 of 287 slices shown, 15 images]
[im 23/287  mediastinal]
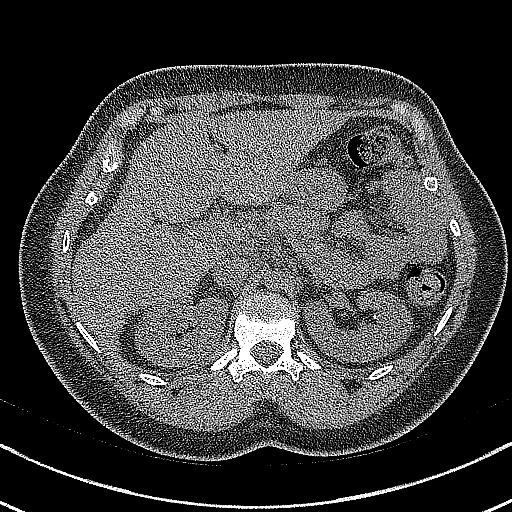
[im 23/287  lung]
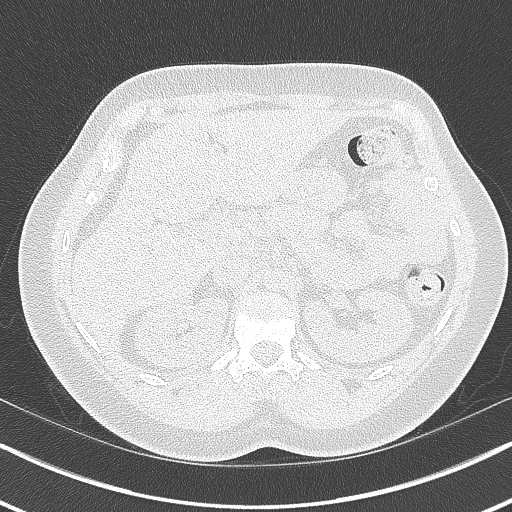
[im 45/287  lung]
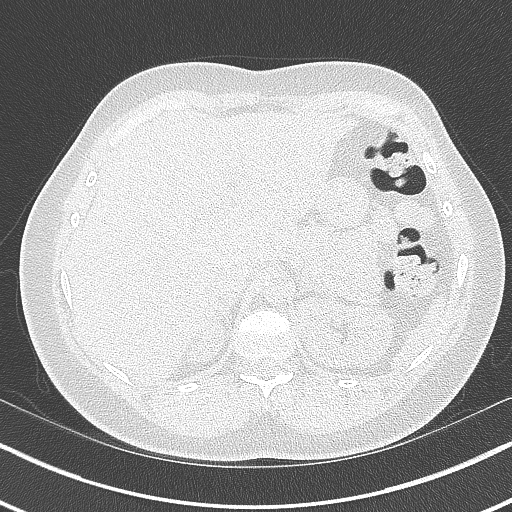
[im 67/287  lung]
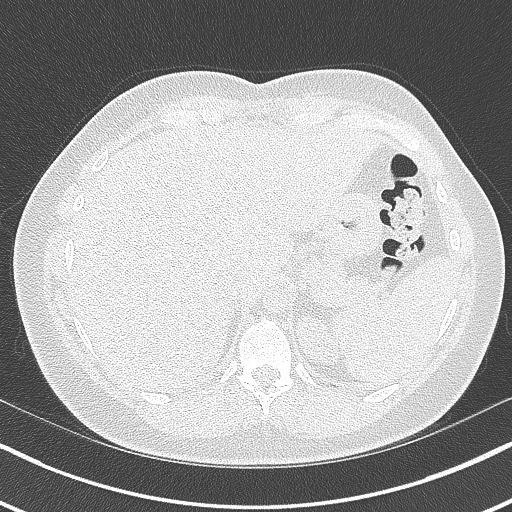
[im 89/287  lung]
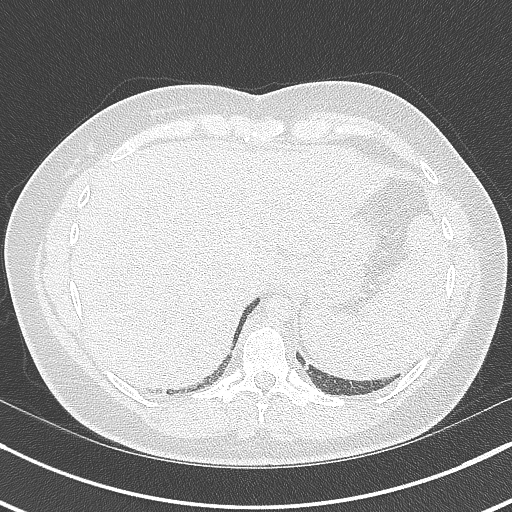
[im 111/287  mediastinal]
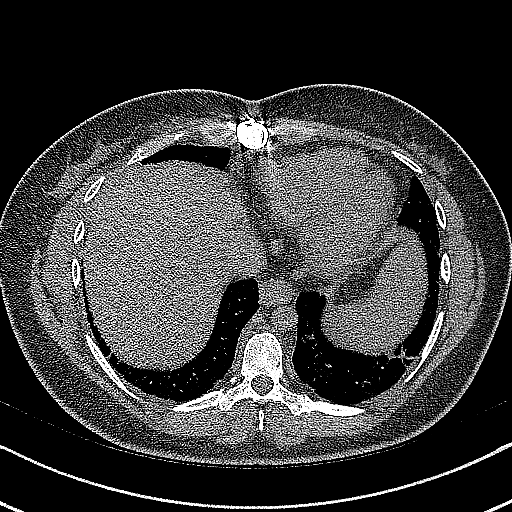
[im 111/287  lung]
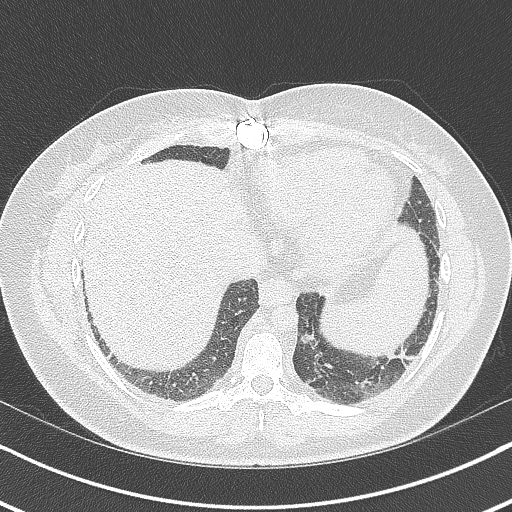
[im 133/287  lung]
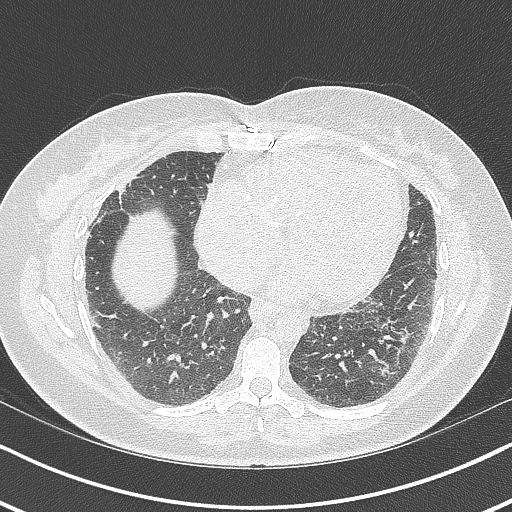
[im 155/287  lung]
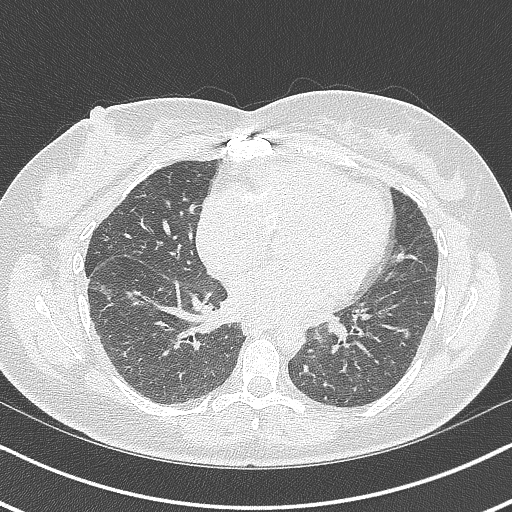
[im 177/287  lung]
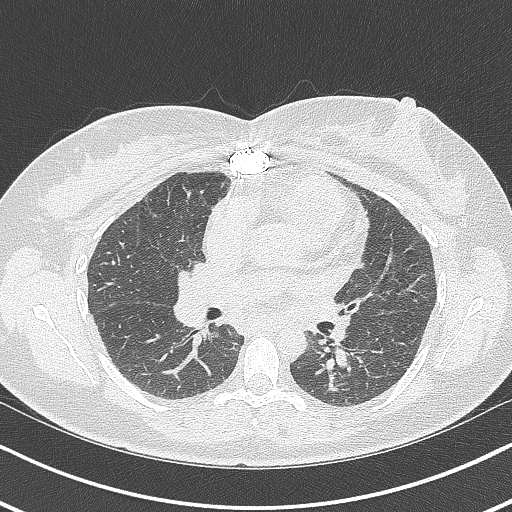
[im 199/287  mediastinal]
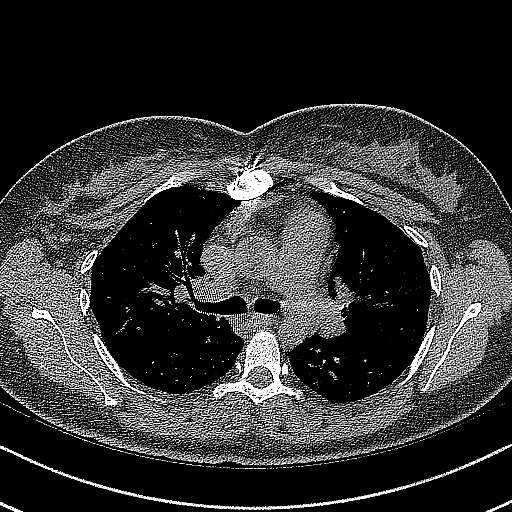
[im 199/287  lung]
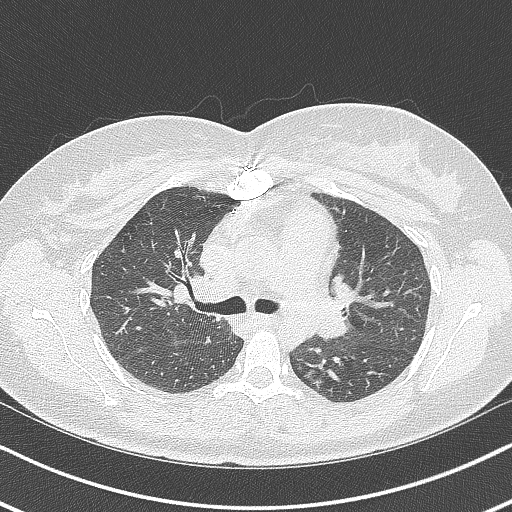
[im 221/287  lung]
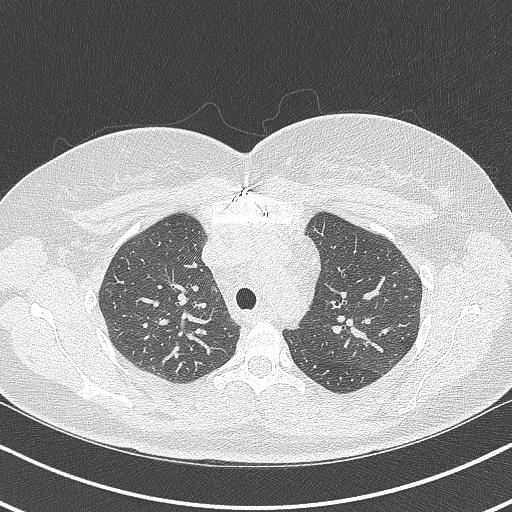
[im 243/287  lung]
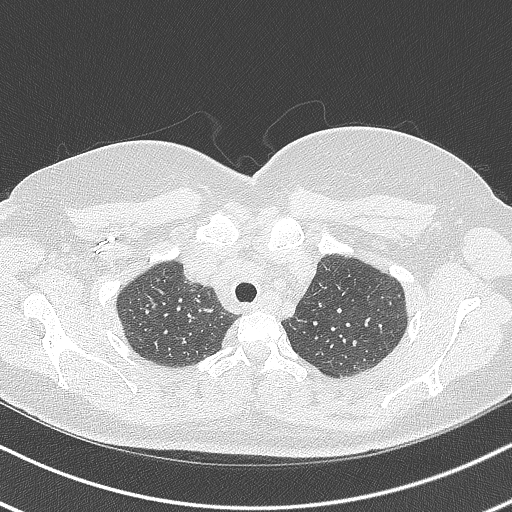
[im 265/287  lung]
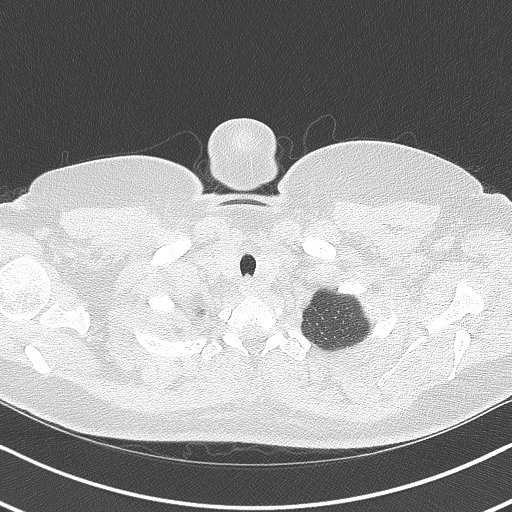

[Series 8: cor soft · coronal · 0.58mm/px · 3 of 135 slices shown]
[im 27/135  lung]
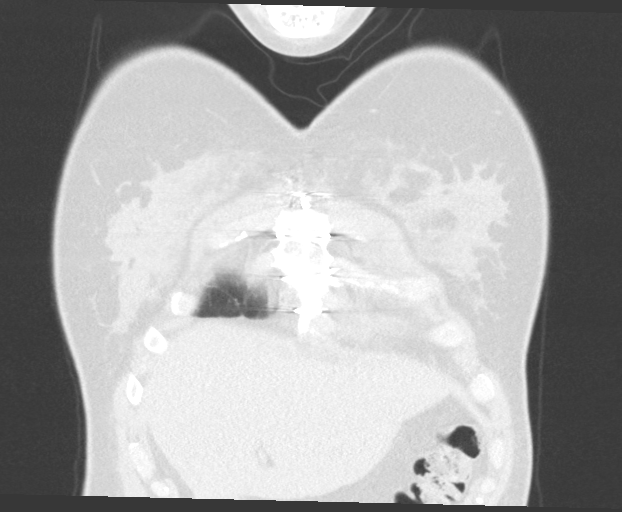
[im 54/135  lung]
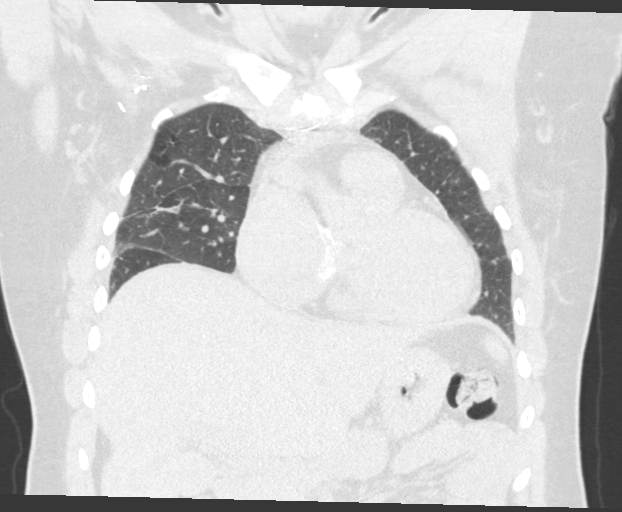
[im 81/135  lung]
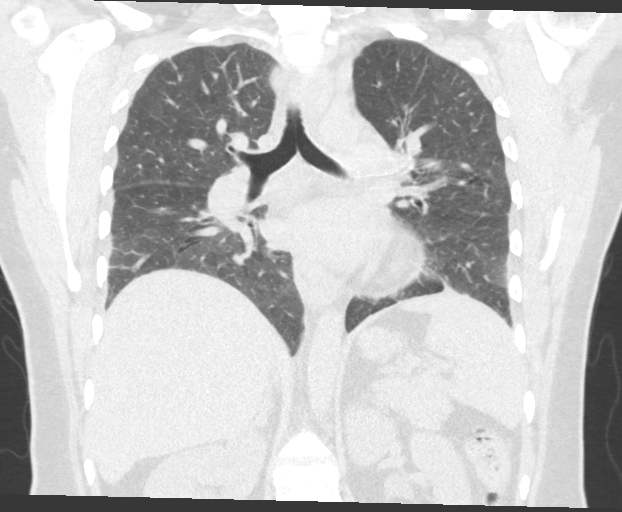

[15 of 36 positions shown; findings below may reference images not displayed]

FINDINGS: Cardiovascular: Atherosclerotic calcification of the aorta.
Tricuspid valve replacement. Pulmonic trunk and heart are enlarged.
No pericardial effusion.

Mediastinum/Nodes: Probable thymic tissue in the prevascular space,
unchanged. Numerous small mediastinal lymph nodes measure up to 8 mm
in the low right paratracheal station, similar. Hilar regions are
difficult to evaluate without IV contrast. No axillary adenopathy.
Surgical clips in the right subpectoral/axillary region. Esophagus
is grossly unremarkable.

Lungs/Pleura: Scattered areas of linear and ground-glass density in
the lung bases, unchanged from 07/05/2018. No overt subpleural
reticulation, traction bronchiectasis/bronchiolectasis,
architectural distortion or honeycombing. Millimetric pulmonary
nodules, unchanged and benign. There may be slight nodularity along
the fissures. No pleural fluid. Airway is unremarkable. There is air
trapping.

Upper Abdomen: Visualized portions of the liver, adrenal glands,
kidneys, spleen, pancreas, stomach and bowel are grossly
unremarkable.

Musculoskeletal: Degenerative changes in the spine. No worrisome
lytic or sclerotic lesions.
IMPRESSION: 1. No definitive evidence of fibrotic interstitial lung disease.
2. Fairly nonspecific appearance of scattered linear densities and
ground-glass in the mid and lower lung zones. Possible fissural
nodularity. Together with numerous small mediastinal lymph nodes,
the possibility of nontypical sarcoid cannot be excluded.
3.  Aortic atherosclerosis ([id number]).
4. Enlarged pulmonic trunk, indicative of pulmonary arterial
hypertension.

## 2023-11-15 DIAGNOSIS — W228XXA Striking against or struck by other objects, initial encounter: Secondary | ICD-10-CM | POA: Diagnosis not present

## 2023-11-15 DIAGNOSIS — S022XXA Fracture of nasal bones, initial encounter for closed fracture: Secondary | ICD-10-CM | POA: Diagnosis not present

## 2023-11-15 DIAGNOSIS — R519 Headache, unspecified: Secondary | ICD-10-CM | POA: Diagnosis not present

## 2023-11-15 DIAGNOSIS — J3489 Other specified disorders of nose and nasal sinuses: Secondary | ICD-10-CM | POA: Diagnosis not present

## 2023-11-19 DIAGNOSIS — F1020 Alcohol dependence, uncomplicated: Secondary | ICD-10-CM | POA: Diagnosis not present

## 2023-11-19 DIAGNOSIS — F1420 Cocaine dependence, uncomplicated: Secondary | ICD-10-CM | POA: Diagnosis not present

## 2023-11-22 DIAGNOSIS — Z3141 Encounter for fertility testing: Secondary | ICD-10-CM | POA: Diagnosis not present

## 2023-11-22 DIAGNOSIS — F411 Generalized anxiety disorder: Secondary | ICD-10-CM | POA: Diagnosis not present

## 2023-11-22 DIAGNOSIS — N939 Abnormal uterine and vaginal bleeding, unspecified: Secondary | ICD-10-CM | POA: Diagnosis not present

## 2023-11-22 DIAGNOSIS — Z8639 Personal history of other endocrine, nutritional and metabolic disease: Secondary | ICD-10-CM | POA: Diagnosis not present

## 2023-12-03 DIAGNOSIS — F1020 Alcohol dependence, uncomplicated: Secondary | ICD-10-CM | POA: Diagnosis not present

## 2023-12-03 DIAGNOSIS — F1420 Cocaine dependence, uncomplicated: Secondary | ICD-10-CM | POA: Diagnosis not present

## 2023-12-10 DIAGNOSIS — Z8751 Personal history of pre-term labor: Secondary | ICD-10-CM | POA: Diagnosis not present

## 2023-12-10 DIAGNOSIS — Z8759 Personal history of other complications of pregnancy, childbirth and the puerperium: Secondary | ICD-10-CM | POA: Diagnosis not present

## 2023-12-10 DIAGNOSIS — Z3169 Encounter for other general counseling and advice on procreation: Secondary | ICD-10-CM | POA: Diagnosis not present

## 2023-12-12 DIAGNOSIS — N838 Other noninflammatory disorders of ovary, fallopian tube and broad ligament: Secondary | ICD-10-CM | POA: Diagnosis not present

## 2023-12-20 DIAGNOSIS — R350 Frequency of micturition: Secondary | ICD-10-CM | POA: Diagnosis not present

## 2023-12-29 DIAGNOSIS — R051 Acute cough: Secondary | ICD-10-CM | POA: Diagnosis not present

## 2024-01-10 DIAGNOSIS — F1420 Cocaine dependence, uncomplicated: Secondary | ICD-10-CM | POA: Diagnosis not present

## 2024-01-10 DIAGNOSIS — F1020 Alcohol dependence, uncomplicated: Secondary | ICD-10-CM | POA: Diagnosis not present

## 2024-01-14 DIAGNOSIS — Z3141 Encounter for fertility testing: Secondary | ICD-10-CM | POA: Diagnosis not present

## 2024-02-04 DIAGNOSIS — F1020 Alcohol dependence, uncomplicated: Secondary | ICD-10-CM | POA: Diagnosis not present

## 2024-02-04 DIAGNOSIS — F1420 Cocaine dependence, uncomplicated: Secondary | ICD-10-CM | POA: Diagnosis not present

## 2024-02-08 DIAGNOSIS — Z3141 Encounter for fertility testing: Secondary | ICD-10-CM | POA: Diagnosis not present

## 2024-03-03 DIAGNOSIS — F1020 Alcohol dependence, uncomplicated: Secondary | ICD-10-CM | POA: Diagnosis not present

## 2024-03-03 DIAGNOSIS — F1420 Cocaine dependence, uncomplicated: Secondary | ICD-10-CM | POA: Diagnosis not present

## 2024-03-24 DIAGNOSIS — F1020 Alcohol dependence, uncomplicated: Secondary | ICD-10-CM | POA: Diagnosis not present

## 2024-03-24 DIAGNOSIS — F1420 Cocaine dependence, uncomplicated: Secondary | ICD-10-CM | POA: Diagnosis not present

## 2024-04-14 DIAGNOSIS — F1020 Alcohol dependence, uncomplicated: Secondary | ICD-10-CM | POA: Diagnosis not present

## 2024-04-14 DIAGNOSIS — F1420 Cocaine dependence, uncomplicated: Secondary | ICD-10-CM | POA: Diagnosis not present

## 2024-05-12 DIAGNOSIS — F1020 Alcohol dependence, uncomplicated: Secondary | ICD-10-CM | POA: Diagnosis not present

## 2024-05-12 DIAGNOSIS — F1420 Cocaine dependence, uncomplicated: Secondary | ICD-10-CM | POA: Diagnosis not present

## 2024-05-13 DIAGNOSIS — F331 Major depressive disorder, recurrent, moderate: Secondary | ICD-10-CM | POA: Diagnosis not present

## 2024-05-14 DIAGNOSIS — F331 Major depressive disorder, recurrent, moderate: Secondary | ICD-10-CM | POA: Diagnosis not present

## 2024-05-15 DIAGNOSIS — F331 Major depressive disorder, recurrent, moderate: Secondary | ICD-10-CM | POA: Diagnosis not present

## 2024-05-29 DIAGNOSIS — J411 Mucopurulent chronic bronchitis: Secondary | ICD-10-CM | POA: Diagnosis not present

## 2024-05-29 DIAGNOSIS — J329 Chronic sinusitis, unspecified: Secondary | ICD-10-CM | POA: Diagnosis not present

## 2024-06-09 DIAGNOSIS — F1420 Cocaine dependence, uncomplicated: Secondary | ICD-10-CM | POA: Diagnosis not present

## 2024-06-09 DIAGNOSIS — F1020 Alcohol dependence, uncomplicated: Secondary | ICD-10-CM | POA: Diagnosis not present

## 2024-06-30 DIAGNOSIS — F1020 Alcohol dependence, uncomplicated: Secondary | ICD-10-CM | POA: Diagnosis not present

## 2024-06-30 DIAGNOSIS — F1420 Cocaine dependence, uncomplicated: Secondary | ICD-10-CM | POA: Diagnosis not present

## 2024-07-10 DIAGNOSIS — I38 Endocarditis, valve unspecified: Secondary | ICD-10-CM | POA: Diagnosis not present

## 2024-07-10 DIAGNOSIS — Z9889 Other specified postprocedural states: Secondary | ICD-10-CM | POA: Diagnosis not present

## 2024-07-10 DIAGNOSIS — I10 Essential (primary) hypertension: Secondary | ICD-10-CM | POA: Diagnosis not present

## 2024-07-10 DIAGNOSIS — I428 Other cardiomyopathies: Secondary | ICD-10-CM | POA: Diagnosis not present

## 2024-07-17 DIAGNOSIS — I428 Other cardiomyopathies: Secondary | ICD-10-CM | POA: Diagnosis not present

## 2024-07-17 DIAGNOSIS — I38 Endocarditis, valve unspecified: Secondary | ICD-10-CM | POA: Diagnosis not present

## 2024-08-11 DIAGNOSIS — F1020 Alcohol dependence, uncomplicated: Secondary | ICD-10-CM | POA: Diagnosis not present

## 2024-08-11 DIAGNOSIS — F1420 Cocaine dependence, uncomplicated: Secondary | ICD-10-CM | POA: Diagnosis not present

## 2024-08-28 DIAGNOSIS — Z01419 Encounter for gynecological examination (general) (routine) without abnormal findings: Secondary | ICD-10-CM | POA: Diagnosis not present

## 2024-08-28 DIAGNOSIS — Z124 Encounter for screening for malignant neoplasm of cervix: Secondary | ICD-10-CM | POA: Diagnosis not present

## 2024-09-01 DIAGNOSIS — F1020 Alcohol dependence, uncomplicated: Secondary | ICD-10-CM | POA: Diagnosis not present

## 2024-09-01 DIAGNOSIS — F1420 Cocaine dependence, uncomplicated: Secondary | ICD-10-CM | POA: Diagnosis not present

## 2024-09-18 DIAGNOSIS — Z0120 Encounter for dental examination and cleaning without abnormal findings: Secondary | ICD-10-CM | POA: Diagnosis not present

## 2024-11-21 ENCOUNTER — Emergency Department (HOSPITAL_COMMUNITY)

## 2024-11-21 ENCOUNTER — Emergency Department (HOSPITAL_COMMUNITY)
Admission: EM | Admit: 2024-11-21 | Discharge: 2024-11-22 | Disposition: A | Discharge status: Home / Self Care | Attending: Emergency Medicine | Admitting: Emergency Medicine

## 2024-11-21 ENCOUNTER — Other Ambulatory Visit: Payer: Self-pay

## 2024-11-21 ENCOUNTER — Encounter (HOSPITAL_COMMUNITY): Payer: Self-pay

## 2024-11-21 DIAGNOSIS — D72829 Elevated white blood cell count, unspecified: Secondary | ICD-10-CM | POA: Diagnosis not present

## 2024-11-21 DIAGNOSIS — R079 Chest pain, unspecified: Secondary | ICD-10-CM | POA: Insufficient documentation

## 2024-11-21 LAB — CBC
HCT: 45 % (ref 36.0–46.0)
Hemoglobin: 15 g/dL (ref 12.0–15.0)
MCH: 29.9 pg (ref 26.0–34.0)
MCHC: 33.3 g/dL (ref 30.0–36.0)
MCV: 89.8 fL (ref 80.0–100.0)
Platelets: 217 K/uL (ref 150–400)
RBC: 5.01 MIL/uL (ref 3.87–5.11)
RDW: 12.8 % (ref 11.5–15.5)
WBC: 11.7 K/uL — ABNORMAL HIGH (ref 4.0–10.5)
nRBC: 0 % (ref 0.0–0.2)

## 2024-11-21 LAB — TROPONIN T, HIGH SENSITIVITY: Troponin T High Sensitivity: <15 ng/L (ref 0–19)

## 2024-11-21 LAB — BASIC METABOLIC PANEL WITH GFR
Anion gap: 14 (ref 5–15)
BUN: 20 mg/dL (ref 6–20)
CO2: 24 mmol/L (ref 22–32)
Calcium: 9.5 mg/dL (ref 8.9–10.3)
Chloride: 100 mmol/L (ref 98–111)
Creatinine, Ser: 0.89 mg/dL (ref 0.44–1.00)
GFR, Estimated: >60 mL/min
Glucose, Bld: 121 mg/dL — ABNORMAL HIGH (ref 70–99)
Potassium: 4 mmol/L (ref 3.5–5.1)
Sodium: 139 mmol/L (ref 135–145)

## 2024-11-21 LAB — HCG, SERUM, QUALITATIVE: Preg, Serum: NEGATIVE

## 2024-11-21 NOTE — ED Triage Notes (Signed)
 Pt endorses collarbone pain that radiates into her chest. Reports the pain occurs when she breathes. Pt w/ cardiac hx.

## 2024-11-21 NOTE — ED Provider Notes (Signed)
 " [facility] Provider Note   CSN: [id number] Arrival date & time: 11/21/24  2112     History Chief Complaint  Patient presents with   Chest Pain    HPI Kelly Mora is a 39 y.o. female presenting for chief complaint of chest pain. States about 6 hours of CP  Pleuritic in nature Left arm to neck. 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 However since her remission she has been relatively healthy.  Denies fevers chills nausea vomiting shortness of breath. This sharp left-sided chest pain started approximately 4 days ago and came on more acutely tonight.  States she was sick with a cough for most of December but otherwise denies other illness.  Patient's recorded medical, surgical, social, medication list and allergies were reviewed in the Snapshot window as part of the initial history.   Review of Systems   Review of Systems  Constitutional:  Negative for chills and fever.  HENT:  Negative for ear pain and sore throat.   Eyes:  Negative for pain and visual disturbance.  Respiratory:  Negative for cough and shortness of breath.   Cardiovascular:  Positive for chest pain. Negative for palpitations.  Gastrointestinal:  Negative for abdominal pain and vomiting.  Genitourinary:  Negative for dysuria and hematuria.  Musculoskeletal:  Negative for arthralgias and back pain.  Skin:  Negative for color change and rash.  Neurological:  Negative for seizures and syncope.  All other systems reviewed and are negative.   Physical Exam Updated Vital Signs BP 108/79 (BP Location: Left Arm)   Pulse 81   Temp 97.9 F (36.6 C) (Oral)   Resp 20   Ht 5' 5 (1.651 m)   Wt 79.4 kg   SpO2 98%   BMI 29.12 kg/m  Physical Exam Vitals and nursing note reviewed.  Constitutional:      General: She is not in acute distress.    Appearance: She is well-developed.  HENT:     Head: Normocephalic  and atraumatic.  Eyes:     Conjunctiva/sclera: Conjunctivae normal.  Cardiovascular:     Rate and Rhythm: Normal rate and regular rhythm.     Heart sounds: No murmur heard.    Comments: Patient exquisitely tender to palpation on the left sternoclavicular joint. Pulmonary:     Effort: Pulmonary effort is normal. No respiratory distress.     Breath sounds: Normal breath sounds.  Chest:     Chest wall: Tenderness present.  Abdominal:     General: There is no distension.     Palpations: Abdomen is soft.     Tenderness: There is no abdominal tenderness. There is no right CVA tenderness or left CVA tenderness.  Musculoskeletal:        General: No swelling or tenderness. Normal range of motion.     Cervical back: Neck supple.  Skin:    General: Skin is warm and dry.  Neurological:     General: No focal deficit present.     Mental Status: She is alert and oriented to person, place, and time. Mental status is at baseline.     Cranial Nerves: No cranial nerve deficit.      ED Course/ Medical Decision Making/ A&P    Procedures Procedures   Medications Ordered in ED Medications  ketorolac  (TORADOL ) 15 MG/ML injection 15 mg (15 mg Intravenous Given 11/22/24 0152)   Medical Decision Making: Kelly Mora is a  39 y.o. female who presented to the ED today with chest pain, detailed above.  Based on patient's comorbidities, patient has a heart score of 2.    Patient placed on continuous vitals and telemetry monitoring while in ED which was reviewed periodically.  Complete initial physical exam performed, notably the patient was HDS in NAD.   Reviewed and confirmed nursing documentation for past medical history, family history, social history.    Initial Assessment: With the patient's presentation of left-sided chest pain, most likely diagnosis is musculoskeletal chest pain versus GERD, although ACS remains on the differential. Other diagnoses were considered including (but not limited to)  pulmonary embolism, community-acquired pneumonia, aortic dissection, pneumothorax, underlying bony abnormality, anemia. These are considered less likely due to history of present illness and physical exam findings.    In particular, concerning pulmonary embolism: Patient is PERC + and the they deny malignancy, recent surgery, history of DVT, or calf tenderness leading to a LOW risk Wells score. Aortic Dissection also reconsidered but seems less likely based on the location, quality, onset, and severity of symptoms in this case.  Patient also has a lack of underlying history of AD or TAA.  This is most consistent with an acute life/limb threatening illness complicated by underlying chronic conditions.   Initial Plan: EKG and serial troponin to evaluate for cardiac pathology. Evaluate for dissection, bony abnormality, or pneumonia with chest x-ray and screening laboratory evaluation including CBC, BMP  Further evaluation for pulmonary embolism  indicated at this time based on patient's PERC and Wells score. Ddimer due to low Wells with plan for CTAPE if >1k  Further evaluation for Thoracic Aortic Dissection not indicated at this time based on patient's clinical history and PE findings.   Initial Study Results: EKG was reviewed independently. Rate, rhythm, axis, intervals all examined and without medically relevant abnormality. ST segments without concerns for elevations.    Laboratory  Delta  troponin demonstrated NAA   CBC and BMP without obvious metabolic or inflammatory abnormalities requiring further evaluation   Radiology  DG Chest 2 View Result Date: 11/21/2024 EXAM: 2 VIEW(S) XRAY OF THE CHEST 11/21/2024 10:05:00 PM COMPARISON: 03/02/2022. CLINICAL HISTORY: Chest pain. FINDINGS: LUNGS AND PLEURA: No focal pulmonary opacity. No pleural effusion. No pneumothorax. HEART AND MEDIASTINUM: Prior CABG. No acute abnormality of the cardiac and mediastinal silhouettes. BONES AND SOFT TISSUES: No  acute osseous abnormality. IMPRESSION: 1. No acute cardiopulmonary process. Electronically signed by: Franky Crease MD 11/21/2024 10:22 PM EST RP Workstation: [id number]    Final Assessment and Plan: Patient's lab work grossly reassuring.  Chest pain improved after Toradol .  Given focal tenderness I believe that costochondritis is more consistent with this patient's presentation rather than ACS based on this presentation. Will treat with NSAIDs x 5 days recommend follow-up with PCP.  Total of 5-1/2 hours of observation with gross improvement during her visit.          Clinical Impression:  1. Chest pain, unspecified type      Discharge   Final Clinical Impression(s) / ED Diagnoses Final diagnoses:  Chest pain, unspecified type    Rx / DC Orders ED Discharge Orders          Ordered    celecoxib  (CELEBREX ) 200 MG capsule  2 times daily        11/22/24 0239              Jerral Meth, MD 11/22/24 0239  "

## 2024-11-22 LAB — D-DIMER, QUANTITATIVE: D-Dimer, Quant: <0.27 ug{FEU}/mL (ref 0.00–0.50)

## 2024-11-22 LAB — TROPONIN T, HIGH SENSITIVITY: Troponin T High Sensitivity: <15 ng/L (ref 0–19)

## 2024-11-22 MED ORDER — KETOROLAC TROMETHAMINE 15 MG/ML IJ SOLN
15.0000 mg | Freq: Once | INTRAMUSCULAR | Status: AC
  Administered 2024-11-22: 15 mg via INTRAVENOUS
  Filled 2024-11-22: qty 1

## 2024-11-22 MED ORDER — CELECOXIB 200 MG PO CAPS: 200.0000 mg | ORAL_CAPSULE | Freq: Two times a day (BID) | ORAL | 0 refills | Status: AC
